# Patient Record
Sex: Female | Born: 1965 | Race: Black or African American | Hispanic: No | Marital: Single | Smoking: Current every day smoker
Health system: Southern US, Community
[De-identification: ages and names within clinical notes are randomized; demographics above are authoritative.]

## PROBLEM LIST (undated history)

## (undated) DIAGNOSIS — Z8719 Personal history of other diseases of the digestive system: Secondary | ICD-10-CM

## (undated) DIAGNOSIS — F419 Anxiety disorder, unspecified: Secondary | ICD-10-CM

## (undated) HISTORY — PX: ABDOMINAL HYSTERECTOMY: SHX81

## (undated) HISTORY — PX: FOOT SURGERY: SHX648

## (undated) HISTORY — DX: Anxiety disorder, unspecified: F41.9

## (undated) HISTORY — DX: Personal history of other diseases of the digestive system: Z87.19

## (undated) HISTORY — PX: COLONOSCOPY: SHX174

---

## 1998-01-15 ENCOUNTER — Emergency Department (HOSPITAL_COMMUNITY): Admission: EM | Admit: 1998-01-15 | Discharge: 1998-01-15 | Payer: Self-pay | Admitting: *Deleted

## 1998-04-19 ENCOUNTER — Emergency Department (HOSPITAL_COMMUNITY): Admission: EM | Admit: 1998-04-19 | Discharge: 1998-04-19 | Payer: Self-pay | Admitting: Emergency Medicine

## 1999-07-28 ENCOUNTER — Emergency Department (HOSPITAL_COMMUNITY): Admission: EM | Admit: 1999-07-28 | Discharge: 1999-07-29 | Payer: Self-pay | Admitting: Emergency Medicine

## 2000-03-04 ENCOUNTER — Emergency Department (HOSPITAL_COMMUNITY): Admission: EM | Admit: 2000-03-04 | Discharge: 2000-03-05 | Payer: Self-pay

## 2002-04-21 ENCOUNTER — Emergency Department (HOSPITAL_COMMUNITY): Admission: EM | Admit: 2002-04-21 | Discharge: 2002-04-22 | Payer: Self-pay | Admitting: Emergency Medicine

## 2002-04-22 ENCOUNTER — Encounter: Payer: Self-pay | Admitting: Emergency Medicine

## 2002-05-24 ENCOUNTER — Emergency Department (HOSPITAL_COMMUNITY): Admission: EM | Admit: 2002-05-24 | Discharge: 2002-05-24 | Payer: Self-pay | Admitting: Emergency Medicine

## 2002-07-05 ENCOUNTER — Emergency Department (HOSPITAL_COMMUNITY): Admission: EM | Admit: 2002-07-05 | Discharge: 2002-07-05 | Payer: Self-pay | Admitting: Emergency Medicine

## 2002-08-27 ENCOUNTER — Emergency Department (HOSPITAL_COMMUNITY): Admission: EM | Admit: 2002-08-27 | Discharge: 2002-08-27 | Payer: Self-pay | Admitting: Emergency Medicine

## 2002-08-27 ENCOUNTER — Encounter: Payer: Self-pay | Admitting: *Deleted

## 2002-10-03 ENCOUNTER — Emergency Department (HOSPITAL_COMMUNITY): Admission: EM | Admit: 2002-10-03 | Discharge: 2002-10-04 | Payer: Self-pay | Admitting: Emergency Medicine

## 2003-01-09 ENCOUNTER — Emergency Department (HOSPITAL_COMMUNITY): Admission: EM | Admit: 2003-01-09 | Discharge: 2003-01-10 | Payer: Self-pay

## 2003-04-16 ENCOUNTER — Emergency Department (HOSPITAL_COMMUNITY): Admission: AD | Admit: 2003-04-16 | Discharge: 2003-04-16 | Payer: Self-pay | Admitting: Emergency Medicine

## 2003-09-08 ENCOUNTER — Emergency Department (HOSPITAL_COMMUNITY): Admission: EM | Admit: 2003-09-08 | Discharge: 2003-09-08 | Payer: Self-pay

## 2004-11-27 ENCOUNTER — Emergency Department (HOSPITAL_COMMUNITY): Admission: EM | Admit: 2004-11-27 | Discharge: 2004-11-27 | Payer: Self-pay | Admitting: Emergency Medicine

## 2005-01-13 ENCOUNTER — Emergency Department (HOSPITAL_COMMUNITY): Admission: EM | Admit: 2005-01-13 | Discharge: 2005-01-13 | Payer: Self-pay | Admitting: Emergency Medicine

## 2005-02-09 ENCOUNTER — Emergency Department (HOSPITAL_COMMUNITY): Admission: EM | Admit: 2005-02-09 | Discharge: 2005-02-09 | Payer: Self-pay | Admitting: Emergency Medicine

## 2005-08-08 ENCOUNTER — Emergency Department (HOSPITAL_COMMUNITY): Admission: EM | Admit: 2005-08-08 | Discharge: 2005-08-08 | Payer: Self-pay | Admitting: Emergency Medicine

## 2005-08-09 ENCOUNTER — Inpatient Hospital Stay (HOSPITAL_COMMUNITY): Admission: AD | Admit: 2005-08-09 | Discharge: 2005-08-09 | Payer: Self-pay | Admitting: *Deleted

## 2005-08-31 ENCOUNTER — Ambulatory Visit: Payer: Self-pay | Admitting: Obstetrics and Gynecology

## 2005-08-31 ENCOUNTER — Encounter (INDEPENDENT_AMBULATORY_CARE_PROVIDER_SITE_OTHER): Payer: Self-pay | Admitting: *Deleted

## 2005-09-03 ENCOUNTER — Ambulatory Visit (HOSPITAL_COMMUNITY): Admission: RE | Admit: 2005-09-03 | Discharge: 2005-09-03 | Payer: Self-pay | Admitting: Obstetrics and Gynecology

## 2005-09-10 ENCOUNTER — Inpatient Hospital Stay (HOSPITAL_COMMUNITY): Admission: AD | Admit: 2005-09-10 | Discharge: 2005-09-10 | Payer: Self-pay | Admitting: Family Medicine

## 2005-09-21 ENCOUNTER — Ambulatory Visit: Payer: Self-pay | Admitting: Obstetrics and Gynecology

## 2006-01-09 ENCOUNTER — Encounter (INDEPENDENT_AMBULATORY_CARE_PROVIDER_SITE_OTHER): Payer: Self-pay | Admitting: *Deleted

## 2006-01-09 ENCOUNTER — Inpatient Hospital Stay (HOSPITAL_COMMUNITY): Admission: AD | Admit: 2006-01-09 | Discharge: 2006-01-10 | Payer: Self-pay | Admitting: Obstetrics and Gynecology

## 2006-01-09 ENCOUNTER — Ambulatory Visit: Payer: Self-pay | Admitting: Family Medicine

## 2006-02-09 ENCOUNTER — Ambulatory Visit: Payer: Self-pay | Admitting: Gynecology

## 2006-03-26 ENCOUNTER — Inpatient Hospital Stay (HOSPITAL_COMMUNITY): Admission: AD | Admit: 2006-03-26 | Discharge: 2006-03-26 | Payer: Self-pay | Admitting: Family Medicine

## 2006-07-24 ENCOUNTER — Emergency Department (HOSPITAL_COMMUNITY): Admission: EM | Admit: 2006-07-24 | Discharge: 2006-07-24 | Payer: Self-pay | Admitting: Emergency Medicine

## 2007-12-11 ENCOUNTER — Emergency Department (HOSPITAL_COMMUNITY): Admission: EM | Admit: 2007-12-11 | Discharge: 2007-12-11 | Payer: Self-pay | Admitting: Family Medicine

## 2008-10-02 ENCOUNTER — Emergency Department (HOSPITAL_COMMUNITY): Admission: EM | Admit: 2008-10-02 | Discharge: 2008-10-03 | Payer: Self-pay | Admitting: Emergency Medicine

## 2008-10-12 ENCOUNTER — Inpatient Hospital Stay (HOSPITAL_COMMUNITY): Admission: AD | Admit: 2008-10-12 | Discharge: 2008-10-12 | Payer: Self-pay | Admitting: Obstetrics & Gynecology

## 2008-10-15 ENCOUNTER — Inpatient Hospital Stay (HOSPITAL_COMMUNITY): Admission: RE | Admit: 2008-10-15 | Discharge: 2008-10-15 | Payer: Self-pay | Admitting: Obstetrics & Gynecology

## 2008-12-13 ENCOUNTER — Emergency Department (HOSPITAL_COMMUNITY): Admission: EM | Admit: 2008-12-13 | Discharge: 2008-12-13 | Payer: Self-pay | Admitting: Emergency Medicine

## 2009-04-27 ENCOUNTER — Encounter (INDEPENDENT_AMBULATORY_CARE_PROVIDER_SITE_OTHER): Payer: Self-pay | Admitting: Emergency Medicine

## 2009-04-27 ENCOUNTER — Ambulatory Visit: Payer: Self-pay | Admitting: Vascular Surgery

## 2009-04-27 ENCOUNTER — Emergency Department (HOSPITAL_COMMUNITY): Admission: EM | Admit: 2009-04-27 | Discharge: 2009-04-27 | Payer: Self-pay | Admitting: Emergency Medicine

## 2009-04-28 ENCOUNTER — Ambulatory Visit: Payer: Self-pay | Admitting: Cardiovascular Disease

## 2009-04-28 ENCOUNTER — Observation Stay (HOSPITAL_COMMUNITY): Admission: EM | Admit: 2009-04-28 | Discharge: 2009-04-29 | Payer: Self-pay | Admitting: Emergency Medicine

## 2009-04-29 ENCOUNTER — Encounter (INDEPENDENT_AMBULATORY_CARE_PROVIDER_SITE_OTHER): Payer: Self-pay | Admitting: Emergency Medicine

## 2010-02-19 ENCOUNTER — Emergency Department (HOSPITAL_COMMUNITY)
Admission: EM | Admit: 2010-02-19 | Discharge: 2010-02-19 | Payer: Self-pay | Source: Home / Self Care | Admitting: Emergency Medicine

## 2010-05-23 ENCOUNTER — Encounter: Payer: Self-pay | Admitting: Obstetrics and Gynecology

## 2010-08-02 LAB — DIFFERENTIAL
Basophils Absolute: 0.1 10*3/uL (ref 0.0–0.1)
Basophils Relative: 1 % (ref 0–1)
Lymphocytes Relative: 27 % (ref 12–46)
Lymphocytes Relative: 32 % (ref 12–46)
Lymphs Abs: 2.7 10*3/uL (ref 0.7–4.0)
Monocytes Absolute: 0.7 10*3/uL (ref 0.1–1.0)
Monocytes Absolute: 1.2 10*3/uL — ABNORMAL HIGH (ref 0.1–1.0)
Monocytes Relative: 12 % (ref 3–12)
Neutro Abs: 5.5 10*3/uL (ref 1.7–7.7)
Neutro Abs: 5.9 10*3/uL (ref 1.7–7.7)
Neutrophils Relative %: 57 % (ref 43–77)

## 2010-08-02 LAB — CBC
Hemoglobin: 12.4 g/dL (ref 12.0–15.0)
Hemoglobin: 13.6 g/dL (ref 12.0–15.0)
MCHC: 33.6 g/dL (ref 30.0–36.0)
MCHC: 33.8 g/dL (ref 30.0–36.0)
RBC: 4.24 MIL/uL (ref 3.87–5.11)
RBC: 4.63 MIL/uL (ref 3.87–5.11)
RDW: 13.2 % (ref 11.5–15.5)
WBC: 10 10*3/uL (ref 4.0–10.5)

## 2010-08-02 LAB — BRAIN NATRIURETIC PEPTIDE: Pro B Natriuretic peptide (BNP): <30 pg/mL (ref 0.0–100.0)

## 2010-08-02 LAB — POCT CARDIAC MARKERS
CKMB, poc: 1.9 ng/mL (ref 1.0–8.0)
CKMB, poc: 2.4 ng/mL (ref 1.0–8.0)
CKMB, poc: <1 ng/mL — ABNORMAL LOW (ref 1.0–8.0)
Myoglobin, poc: 109 ng/mL (ref 12–200)
Myoglobin, poc: 112 ng/mL (ref 12–200)
Myoglobin, poc: 90.5 ng/mL (ref 12–200)
Troponin i, poc: <0.05 ng/mL (ref 0.00–0.09)
Troponin i, poc: <0.05 ng/mL (ref 0.00–0.09)

## 2010-08-02 LAB — URINALYSIS, ROUTINE W REFLEX MICROSCOPIC
Leukocytes, UA: NEGATIVE
Specific Gravity, Urine: 1.021 (ref 1.005–1.030)
Urobilinogen, UA: 0.2 mg/dL (ref 0.0–1.0)

## 2010-08-02 LAB — CK TOTAL AND CKMB (NOT AT ARMC)
CK, MB: 2 ng/mL (ref 0.3–4.0)
CK, MB: 2.4 ng/mL (ref 0.3–4.0)
CK, MB: 2.6 ng/mL (ref 0.3–4.0)
Relative Index: 1.1 (ref 0.0–2.5)
Relative Index: 1.2 (ref 0.0–2.5)
Relative Index: 1.3 (ref 0.0–2.5)
Total CK: 184 U/L — ABNORMAL HIGH (ref 7–177)

## 2010-08-02 LAB — URINE CULTURE

## 2010-08-02 LAB — POCT I-STAT, CHEM 8
BUN: 7 mg/dL (ref 6–23)
Calcium, Ion: 1.11 mmol/L — ABNORMAL LOW (ref 1.12–1.32)
Chloride: 105 mEq/L (ref 96–112)
Chloride: 106 mEq/L (ref 96–112)
Creatinine, Ser: 1.1 mg/dL (ref 0.4–1.2)
Glucose, Bld: 105 mg/dL — ABNORMAL HIGH (ref 70–99)
HCT: 43 % (ref 36.0–46.0)
HCT: 43 % (ref 36.0–46.0)
Potassium: 3.3 mEq/L — ABNORMAL LOW (ref 3.5–5.1)
Potassium: 3.9 mEq/L (ref 3.5–5.1)
Sodium: 140 mEq/L (ref 135–145)

## 2010-08-02 LAB — CULTURE, BLOOD (ROUTINE X 2): Culture: NO GROWTH

## 2010-08-02 LAB — PROTIME-INR
INR: 1.07 (ref 0.00–1.49)
Prothrombin Time: 13.8 seconds (ref 11.6–15.2)

## 2010-08-02 LAB — URINE MICROSCOPIC-ADD ON

## 2010-08-02 LAB — TROPONIN I: Troponin I: 0.02 ng/mL (ref 0.00–0.06)

## 2010-08-02 LAB — APTT: aPTT: 30 seconds (ref 24–37)

## 2010-08-09 LAB — COMPREHENSIVE METABOLIC PANEL
ALT: 30 U/L (ref 0–35)
AST: 23 U/L (ref 0–37)
Albumin: 3.9 g/dL (ref 3.5–5.2)
Calcium: 9.3 mg/dL (ref 8.4–10.5)
Creatinine, Ser: 0.99 mg/dL (ref 0.4–1.2)
GFR calc Af Amer: >60 mL/min (ref >60)
Sodium: 137 mEq/L (ref 135–145)
Total Protein: 7 g/dL (ref 6.0–8.3)

## 2010-08-09 LAB — DIFFERENTIAL
Eosinophils Absolute: 0.3 10*3/uL (ref 0.0–0.7)
Eosinophils Relative: 3 % (ref 0–5)
Lymphocytes Relative: 35 % (ref 12–46)
Lymphs Abs: 3.9 10*3/uL (ref 0.7–4.0)
Monocytes Absolute: 0.8 10*3/uL (ref 0.1–1.0)
Monocytes Relative: 7 % (ref 3–12)

## 2010-08-09 LAB — CBC
MCHC: 34.9 g/dL (ref 30.0–36.0)
MCV: 86.5 fL (ref 78.0–100.0)
Platelets: 261 10*3/uL (ref 150–400)
RBC: 4.65 MIL/uL (ref 3.87–5.11)

## 2010-08-09 LAB — URINALYSIS, ROUTINE W REFLEX MICROSCOPIC
Bilirubin Urine: NEGATIVE
Glucose, UA: NEGATIVE mg/dL
Ketones, ur: NEGATIVE mg/dL
Leukocytes, UA: NEGATIVE
pH: 6 (ref 5.0–8.0)

## 2010-08-09 LAB — WET PREP, GENITAL
Trich, Wet Prep: NONE SEEN
Yeast Wet Prep HPF POC: NONE SEEN

## 2010-09-17 NOTE — Group Therapy Note (Signed)
Audrey, Ramirez NO.:  [id number]   MEDICAL RECORD NO.:  [id number]          PATIENT TYPE:  WOC   LOCATION:  [facility]                   FACILITY:  [facility]   PHYSICIAN:  Argentina Donovan, MD        DATE OF BIRTH:  [birth year]   DATE OF SERVICE:  08/31/2005                                    CLINIC NOTE   The patient is a 46 year old black female gravida 2, para 1-0-1-1 who went  into the MAU because severe abdominal pain.  She states that she has had the  pain on and off, mainly on, for about three years.  She has had multiple  visits, apparently, to [facility] and [facility] where they told her there  was no problems.  In addition to the pain which she describes as almost an  anterior belt pain just level with the umbilicus she complains of severe  dyspareunia.  They did an ultrasound in the MAU which results are not well  described but patient has a small intramural fibroid.  On examination the  uterus is about 8 weeks size, firm in configuration, not well mobile but  adnexa seems normal.   IMPRESSION:  Probably that she does have fibroids with possible  endometriosis and that her pain on intercourse is from deep penetration  causing ovarian trauma.  We have talked with her about this.  In addition,  she is a known patient with irritable bowel syndrome.  She has a difficult  time because her job entails standing for long periods and that is when the  pain seems to get worse.  It is improved by lying down.  She is a  complicated patient from this point of view but I feel that the pain is  real.  I think she does have a spastic colon but I think it is a strong  possibility of pain secondary to her pelvic organ condition.  I therefore  think we will end up doing a total abdominal hysterectomy with or without  oophorectomy because I think that endometriosis is a real possibility.  Her  periods are regular.  The pain does not seem to be worse during a period,  she  says.  The radiologist recommended MRI for the possibility of evaluating  better the pelvic anatomy and ruling out adenomyosis.  If we are going to do  that I told her we would also get an abdominal one since most of the pain  that she is describing seems to be out of the pelvis.  However, when I am  examining her and I push on the uterus she describes that as causing the  pain that she is feeling and duplicating it so I am somewhat confused by the  examination and by her findings.   PHYSICAL EXAMINATION:  PELVIC:  External genitalia is normal.  BUS within  normal limits.  Vagina is clean and well rugated.  The cervix is clean and  parous.  The uterus is about 8 weeks size, slightly irregular in  configuration, and mobile with difficulty.  Adnexa could not be well  outlined, but  did not seem to be enlarged.  Cul-de-sac seems somewhat full.           ______________________________  Argentina Donovan, MD     PR/MEDQ  D:  08/31/2005  T:  09/01/2005  Job:  [id number]

## 2010-09-17 NOTE — Group Therapy Note (Signed)
NAMERENLEIGH, OUELLET NO.:  [id number]   MEDICAL RECORD NO.:  [id number]          PATIENT TYPE:  WOC   LOCATION:  [facility]                   FACILITY:  [facility]   PHYSICIAN:  Argentina Donovan, MD        DATE OF BIRTH:  24-Aug-1965   DATE OF SERVICE:  09/21/2005                                    CLINIC NOTE   REASON FOR VISIT:  A 45 year old, African-American female, G2, P1-0-1-1 with  a history of tubal ligation who has chronic pelvic pain as well as spastic  colon.  She has a uterus that is very tender to the touch and by MRI signs  of adenomyosis.  She also has severe premenstrual dysphoric disorder and  desires surgery to at least take away the problems related to her uterus.  We have also talked about oophorectomy because of the PMS and we are going  to try and do this vaginally.  She has a uterus that measures about 8 weeks  in size, otherwise her physical examination is normal.  She does complain of  severe dyspareunia which I think is related to her adenomyosis rather than  her irritable bowel syndrome.  She has recently been on Metamucil and has  been much improved as far as GI symptoms.  See the previous note.  We are  planning on probably doing this in September once school starts.  We have  told her that she would probably be out of work at [employer] for at least  6 weeks after surgery.  We are going to bring her in a few days before  surgery and discuss in detail the risks as well as what to expect in  surgery.   IMPRESSION:  1.  Chronic pelvic pain with adenomyosis.  2.  Premenstrual dysphoric disorder.  3.  Irritable bowel syndrome.           ______________________________  Argentina Donovan, MD     PR/MEDQ  D:  09/21/2005  T:  09/22/2005  Job:  [id number]

## 2010-09-17 NOTE — Discharge Summary (Signed)
Audrey Ramirez, Audrey Ramirez             ACCOUNT NO.:  [id number]   MEDICAL RECORD NO.:  [id number]          PATIENT TYPE:  INP   LOCATION:  9318                          [facility]   PHYSICIAN:  Phil D. Okey Dupre, M.D.     DATE OF BIRTH:  May 31, 1965   DATE OF ADMISSION:  01/09/2006  DATE OF DISCHARGE:  01/10/2006                                 DISCHARGE SUMMARY   The patient is a 45 year old African American female gravida 2, para 1-0-0-  1, with a history of chronic pelvic pain, menorrhagia, severe dysmenorrhea,  and severe PMDD.   POSTOPERATIVE DIAGNOSES:  1. Chronic pelvic pain.  2. Menorrhagia.  3. Severe dysmenorrhea.  4. Premenstrual dysphoric disorder.  Pending pathology report.   PROCEDURE:  Total vaginal hysterectomy and bilateral salpingo-oophorectomy.   SURGEON:  Dr. Malvin Johns.   ASSISTANT:  Dr. Okey Dupre.   The patient underwent the operative procedure on the day of admission, has  done extremely well overnight, is very anxious for discharge, has had some  nausea and vomiting during the night probably secondary to the narcotic PCA  which is being stopped.  She feels well and hungry this morning, is  ambulating well.   PHYSICAL EXAMINATION:  LUNGS:  Clear.  VITAL SIGNS:  Have been stable and normal.  ABDOMEN:  Soft, flat, nontender.  EXTREMITIES:  Negative.  PELVIC:  There is no significant vaginal discharge and no CVA tenderness.   Her preoperative hemoglobin was 13.4 with a post-op in the a.m. after  surgery of 11.6.   The patient otherwise has done well and is discharged to be followed in the  office in two weeks, has been given instructions as to activity, followup,  and diet.   DISCHARGE DIAGNOSES:  Status post total vaginal hysterectomy and bilateral  salpingo-oophorectomy for pelvic pain.           ______________________________  Javier Glazier. Okey Dupre, M.D.    PDR/MEDQ  D:  01/10/2006  T:  01/10/2006  Job:  [id number]

## 2010-09-17 NOTE — H&P (Signed)
Audrey Ramirez, SKIPPER NO.:  [id number]   MEDICAL RECORD NO.:  [id number]           PATIENT TYPE:   LOCATION:                                [facility]   PHYSICIAN:  Phil D. Okey Dupre, M.D.     DATE OF BIRTH:  [birth year]   DATE OF ADMISSION:  01/02/2006  DATE OF DISCHARGE:                                HISTORY & PHYSICAL   CHIEF COMPLAINT:  Chronic severe pelvic pain with severe premenstrual and  terrible depression before periods.   HISTORY OF PRESENT ILLNESS:  A 45 year old African-American female, gravida  2, para 1-0-1-1 with a child 77 years old and a followup history of tubal  ligation 5 years later who has chronic pain in the pelvis as well as spastic  colon. The uterus is very tender to touch and by MRI shows signs of  adenomyosis. She also has severe premenstrual dysphoric disorder and desires  surgery at least to take the problems related to her uterus away. We talked  about oophorectomy and we will probably try and do this if it is feasible  through the vagina. The plan is total vaginal hysterectomy with bilateral  salpingo-oophorectomy. We discussed today the possibility of complications  with the patient especially those related to anesthesia, injury to bowel or  urinary tract, hemorrhage and infection. She does also complain of severe  dyspareunia I think is related more toward adenomyosis than her irritable  bowel syndrome. She has recently been on Metamucil and is much improved as  far as the GI symptoms. She works at [employer] and we told her she will  be out at least 6 weeks after surgery.   PAST SURGICAL HISTORY:  She has had a foot surgery about 7 years ago and a  tubal ligation approximately 10 years ago by laparoscopy.   ALLERGIES:  None.   MEDICATIONS:  She takes Geritol every day.   FAMILY HISTORY:  Her grandmother had tuberculosis otherwise there is no  family history of high blood pressure or diabetes or any other serious  illnesses.   REVIEW OF SYSTEMS:  Negative with the exception of very poor dentition,  irritable bowel syndrome discussed above and the present illness.   PHYSICAL EXAMINATION:  VITAL SIGNS:  Blood pressure was 113/71, pulse 92,  temperature 98.8, respirations 18 per minute. The patient weighs 130 pounds  and is 5 foot 2-1/2 inches tall.  GENERAL:  A well-developed, well-nourished, black female in no acute  distress.  HEENT:  PERRLA, very poor dentition otherwise within normal limits.  NECK:  Supple with no significant masses, thyroid symmetrical without  dominant masses.  BACK:  Erect. No CVA tenderness.  LUNGS:  Clear to auscultation and percussion.  HEART:  No murmur, normal sinus rhythm.  ABDOMEN:  Soft, flat, somewhat tender to deep palpation suprapubically  without guarding or rebound. Good bowel sounds. Small surgical scar just  below the umbilicus from previous laparoscopy.  BREASTS:  Symmetrical with no dominant masses.  EXTREMITIES:  There is no edema, no varices, deep tendon reflexes are within  normal limits.  GENITALIA:  External  genitalia is normal. BUS within normal limits,  introitus is marital. Vagina is clean and well rugated. The cervix is parous  and clean. Pap smear was negative. The uterus is of normal size, shape,  consistency, slightly boggy and somewhat tender to palpation. The adnexa  could not be well palpated but no enlargement of the adnexa was noted. The  cul-de-sac is free.   IMPRESSION:  1. Dyspareunia.  2. Severe dysmenorrhea.  3. Premenstrual dysphoric disorder.   PLAN:  Total abdominal hysterectomy with bilateral salpingo-oophorectomy. We  discussed estrogen replacement with the patient afterwards. We have answered  her questions as to activity in followup. She plans on being married shortly  after the surgery.           ______________________________  Javier Glazier Okey Dupre, M.D.     PDR/MEDQ  D:  01/03/2006  T:  01/03/2006  Job:  [id number]

## 2010-09-17 NOTE — Op Note (Signed)
NAMESOLEI, WUBBEN             ACCOUNT NO.:  [id number]   MEDICAL RECORD NO.:  [id number]          PATIENT TYPE:  INP   LOCATION:  9318                          [facility]   PHYSICIAN:  Tanya S. Shawnie Pons, M.D.   DATE OF BIRTH:  12-24-1965   DATE OF PROCEDURE:  01/09/2006  DATE OF DISCHARGE:                                 OPERATIVE REPORT   PREOPERATIVE DIAGNOSIS:  Chronic pelvic pain, dysmenorrhea, dyspareunia and  PMDD.   POSTOPERATIVE DIAGNOSIS:  Chronic pelvic pain, dysmenorrhea, dyspareunia and  PMDD, fibroid uterus, left ovarian cyst.   OPERATION PERFORMED:  Transvaginal hysterectomy.   SURGEON:  Shelbie Proctor. Shawnie Pons, M.D.   ASSISTANTMichele Mcalpine D. Okey Dupre, M.D.   ANESTHESIA:  General and local.   ASSESSMENT:  Uterus, tubes and ovaries.   ESTIMATED BLOOD LOSS:  200 mL.   COMPLICATIONS:  None.   INDICATIONS FOR PROCEDURE:  Briefly the patient is a 45 year old gravida 2,  para 1-0-1-1, who had a history of previous bilateral tubal ligation who had  chronic pelvic pain, questionable adenomyosis by MRI as well as the symptoms  as outlined above.  The patient desired permanent treatment with TVH and  BSO.   DESCRIPTION OF PROCEDURE:  The patient was taken to the operating room where  she was placed in dorsal lithotomy position in Volant stirrups.  She was  prepped and draped in the usual sterile fashion.  Lowe speculum was placed  inside the vagina.  The cervix was circumferentially injected with 0.5%  Marcaine with epinephrine circumferentially.  A circumferential incision was  made about the cervix through the vagina.  Blunt dissection was used to  dissect the vagina off the underlying cervix.  Anteriorly the anterior  peritoneal cavity was entered sharply without difficulty and a retractor  placed inside this opening.  Similarly the posterior peritoneal cavity was  entered sharply and tied with a 0 Vicryl suture.  A long weighted speculum  was then placed inside the  peritoneal cavity.  The uterosacral ligaments  were clamped, cut and suture ligated with 0 chromic suture in a Heaney type  stitch.  These sutures were held.  The Gyrus was then used to take down the  uterine and in sequential steps up the uterus until the tubo-ovarian  pedicles were found.  These were clamped and the uterus was removed.  The  patient's right ovary and tubes were easily visualized at this time so they  were brought up through the incision.  The infundibulopelvic was clamped and  cut.  A small amount of bleeding from the most medial portion of the  infundibulopelvic ligament and another clamp was placed here.  Free ties and  a stick tie were then used to secure the pedicles around both sides and good  hemostasis was noted.  The left tube was brought down and found to have a  large cyst as well as adhesions of the tube to the ovary but the  infundibulopelvic ligament was clamped and a free tie and a stick tie were  used to secure the pedicle.  Some of the portions of  tissue were also taken  down and free ties were used to create hemostasis.  Post delivery of all  specimens which were sent to pathology, all of the pedicles were examined  and found to be hemostatic.  Then anteriorly, an Allis clamp was used to  grasp the anterior peritoneum as well as just a section of the vagina.  A  long __________ chromic suture was then used to close the vagina  horizontally incorporating the aforementioned uterosacral pedicles as well  as the peritoneum posteriorly.  Good hemostasis was again noted and all ties  and instruments were removed from the vagina.  The Foley catheter was placed  inside the bladder.  Clear yellow urine was noted.  All sponge, lap, needle  and instruments were all correct.  The patient was awakened and taken to  recovery room in stable condition.           ______________________________  Shelbie Proctor Shawnie Pons, M.D.     TSP/MEDQ  D:  01/09/2006  T:  01/10/2006  Job:   [id number]

## 2010-10-12 ENCOUNTER — Emergency Department (HOSPITAL_COMMUNITY): Payer: Self-pay

## 2010-10-12 ENCOUNTER — Emergency Department (HOSPITAL_COMMUNITY)
Admission: EM | Admit: 2010-10-12 | Discharge: 2010-10-12 | Disposition: A | Discharge status: Home / Self Care | Payer: Self-pay | Attending: Emergency Medicine | Admitting: Emergency Medicine

## 2010-10-12 DIAGNOSIS — J40 Bronchitis, not specified as acute or chronic: Secondary | ICD-10-CM | POA: Insufficient documentation

## 2010-10-12 DIAGNOSIS — J45909 Unspecified asthma, uncomplicated: Secondary | ICD-10-CM | POA: Insufficient documentation

## 2010-10-12 DIAGNOSIS — F17200 Nicotine dependence, unspecified, uncomplicated: Secondary | ICD-10-CM | POA: Insufficient documentation

## 2010-10-12 DIAGNOSIS — R059 Cough, unspecified: Secondary | ICD-10-CM | POA: Insufficient documentation

## 2010-10-12 DIAGNOSIS — R0602 Shortness of breath: Secondary | ICD-10-CM | POA: Insufficient documentation

## 2010-10-12 DIAGNOSIS — R05 Cough: Secondary | ICD-10-CM | POA: Insufficient documentation

## 2011-07-17 ENCOUNTER — Emergency Department (HOSPITAL_COMMUNITY): Payer: Self-pay

## 2011-07-17 ENCOUNTER — Encounter (HOSPITAL_COMMUNITY): Payer: Self-pay | Admitting: Emergency Medicine

## 2011-07-17 ENCOUNTER — Emergency Department (HOSPITAL_COMMUNITY)
Admission: EM | Admit: 2011-07-17 | Discharge: 2011-07-17 | Disposition: A | Discharge status: Home / Self Care | Payer: Self-pay | Attending: Emergency Medicine | Admitting: Emergency Medicine

## 2011-07-17 DIAGNOSIS — J40 Bronchitis, not specified as acute or chronic: Secondary | ICD-10-CM | POA: Insufficient documentation

## 2011-07-17 DIAGNOSIS — J45909 Unspecified asthma, uncomplicated: Secondary | ICD-10-CM | POA: Insufficient documentation

## 2011-07-17 MED ORDER — ALBUTEROL SULFATE HFA 108 (90 BASE) MCG/ACT IN AERS
2.0000 | INHALATION_SPRAY | RESPIRATORY_TRACT | Status: DC | PRN
  Administered 2011-07-17: 2 via RESPIRATORY_TRACT
  Filled 2011-07-17: qty 6.7

## 2011-07-17 MED ORDER — ALBUTEROL SULFATE (5 MG/ML) 0.5% IN NEBU
5.0000 mg | INHALATION_SOLUTION | Freq: Once | RESPIRATORY_TRACT | Status: AC
  Administered 2011-07-17: 5 mg via RESPIRATORY_TRACT
  Filled 2011-07-17: qty 1

## 2011-07-17 MED ORDER — IBUPROFEN 800 MG PO TABS
800.0000 mg | ORAL_TABLET | Freq: Once | ORAL | Status: AC
  Administered 2011-07-17: 800 mg via ORAL
  Filled 2011-07-17: qty 1

## 2011-07-17 MED ORDER — AZITHROMYCIN 250 MG PO TABS: ORAL_TABLET | ORAL | Status: AC

## 2011-07-17 MED ORDER — IPRATROPIUM BROMIDE 0.02 % IN SOLN
0.5000 mg | Freq: Once | RESPIRATORY_TRACT | Status: AC
  Administered 2011-07-17: 0.5 mg via RESPIRATORY_TRACT
  Filled 2011-07-17: qty 2.5

## 2011-07-17 NOTE — ED Provider Notes (Signed)
History     CSN: [id number]  Arrival date & time 07/17/11  4098   None     Chief Complaint  Patient presents with  . Cough    (Consider location/radiation/quality/duration/timing/severity/associated sxs/prior treatment) Patient is a 46 y.o. female presenting with cough. The history is provided by the patient. No language interpreter was used.  Cough This is a new problem. The current episode started more than 1 week ago. The cough is productive of sputum. The maximum temperature recorded prior to her arrival was 102 to 102.9 F. Associated symptoms include chills, sweats, weight loss, ear pain, rhinorrhea, sore throat, myalgias, shortness of breath and wheezing. Pertinent negatives include no chest pain and no headaches. She has tried decongestants for the symptoms. The treatment provided mild relief. She is not a smoker. Her past medical history is significant for bronchitis, pneumonia and asthma. Her past medical history does not include bronchiectasis, COPD or emphysema.  Upper respiratory symptoms x 1 month.  Cough especially at night.   Past Medical History  Diagnosis Date  . Asthma     No past surgical history on file.  No family history on file.  History  Substance Use Topics  . Smoking status: Not on file  . Smokeless tobacco: Not on file  . Alcohol Use: 1.2 oz/week    2 Cans of beer per week    OB History    Grav Para Term Preterm Abortions TAB SAB Ect Mult Living                  Review of Systems  Constitutional: Positive for chills and weight loss.  HENT: Positive for ear pain, congestion, sore throat, rhinorrhea and postnasal drip. Negative for tinnitus.   Respiratory: Positive for cough, shortness of breath and wheezing. Negative for chest tightness.   Cardiovascular: Negative for chest pain.  Musculoskeletal: Positive for myalgias.  Neurological: Negative for dizziness, syncope, light-headedness and headaches.    Allergies  Review of patient's  allergies indicates no known allergies.  Home Medications   Current Outpatient Rx  Name Route Sig Dispense Refill  . DM-GUAIFENESIN ER 30-600 MG PO TB12 Oral Take 1 tablet by mouth every 12 (twelve) hours.    Marland Kitchen ALKA-SELTZER PLS NIGHT CLD/FLU PO Oral Take 2 tablets by mouth at bedtime as needed. Cold/cough flu      BP 154/114  Pulse 52  Temp(Src) 98.1 F (36.7 C) (Oral)  Resp 22  SpO2 98%  Physical Exam  Nursing note and vitals reviewed. Constitutional: She is oriented to person, place, and time. She appears well-developed and well-nourished.  HENT:  Head: Normocephalic and atraumatic.  Eyes: Conjunctivae and EOM are normal. Pupils are equal, round, and reactive to light.  Neck: Normal range of motion. Neck supple.  Cardiovascular: Normal rate, regular rhythm, normal heart sounds and intact distal pulses.  Exam reveals no gallop and no friction rub.   No murmur heard. Pulmonary/Chest: Effort normal. She has wheezes.       Rh rll  Abdominal: Soft. Bowel sounds are normal.  Musculoskeletal: Normal range of motion. She exhibits no edema and no tenderness.  Neurological: She is alert and oriented to person, place, and time. She has normal reflexes.  Skin: Skin is warm and dry.  Psychiatric: She has a normal mood and affect.    ED Course  Procedures (including critical care time)  Labs Reviewed - No data to display No results found.   No diagnosis found.    MDM  Cough and upper respiratory symptoms x 1 month treated with inhaler and ibuprofen.  Feels better.  Will get pcp from list to follow up with.  Return for high fever or uncontrolled nausea and vomiting or cp.  Chest x-ray normal.        Jethro Bastos, NP 07/18/11 1029

## 2011-07-17 NOTE — Discharge Instructions (Signed)
Audrey Ramirez the chest x-ray shows no pneumonia.  Use the inhaler no more than 4 times a day.  Take ibuprofen 600mg  every 6 hours x 24.  Follow up with a pcp of your choice of pick one from the  list below.   RESOURCE GUIDE  Dental Problems  Patients with Medicaid: [facility] [phone] [address]                                           [phone] [address] Phone:  [phone]                                                  Phone:  [phone]  If unable to pay or uninsured, contact:  [facility] or [facility]. to become qualified for the adult dental clinic.  Chronic Pain Problems Contact [facility]  [phone] Patients need to be referred by their primary care doctor.  Insufficient Money for Medicine Contact [employer]:  call "211" or [facility] 219-047-8773.  No Primary Care Doctor Call [facility]  [phone] Other agencies that provide inexpensive medical care    [facility]  [phone]    [facility]  [phone]    [facility]  [phone]    [facility]  [phone]    [facility]  3143222772    [facility]  [phone]  Psychological Services [facility]  [phone] [facility]  [phone] [facility]   [phone] (emergency services [phone])  Substance Abuse Resources [facility]  (224)088-9538 [facility] [phone] [facility] [phone] Floydene Flock [phone] Residential & Outpatient Substance Abuse Program  [phone]  Abuse/Neglect [facility] Child Abuse Hotline (320)330-6566 [facility] Child Abuse Hotline (220)133-9457 (After Hours)  [facility]gency [facility] 760-767-6150  [facility] [place] [facility] [place] 9316578339 [facility] Services [phone]  MRSA Hotline #:   715-060-8819    [facility] Resources  [facility]     [employer]                          [facility] [address] [place]                       [address]      [address]  [place]                                                Cristobal Goldmann Phone:  [phone]                                   Phone:  [phone]                 Phone:  [phone]  [facility]  [facility] Phone:  [phone]  [facility] Child Abuse Hotline (732)471-5112 (941)212-6601 (After Hours) Bronchitis Bronchitis is a problem of the air tubes leading to your lungs. This problem makes it hard for air to get in and out of the lungs. You may cough a lot because your air tubes are narrow. Going without care can cause lasting (chronic)  bronchitis. HOME CARE   Drink enough fluids to keep your pee (urine) clear or pale yellow.   Use a cool mist humidifier.   Quit smoking if you smoke. If you keep smoking, the bronchitis might not get better.   Only take medicine as told by your doctor.  GET HELP RIGHT AWAY IF:   Coughing keeps you awake.   You start to wheeze.   You become more sick or weak.   You have a hard time breathing or get short of breath.   You cough up blood.   Coughing lasts more than 2 weeks.   You have a fever.   Your baby is older than 3 months with a rectal temperature of 102 F (38.9 C) or higher.   Your baby is 70 months old or younger with a rectal temperature of 100.4 F (38 C) or higher.  MAKE SURE YOU:  Understand these instructions.   Will watch your condition.   Will get help right away if you are not doing well or get worse.  Document Released: 10/05/2007 Document Revised: 04/07/2011 Document Reviewed: 03/20/2009 [facility] Patient Information 2012 [place], [place].Antibiotic Nonuse  Your caregiver felt that the infection or problem was not one that would be helped with an antibiotic. Infections may be caused by viruses or bacteria. Only a caregiver can tell which one of these is the likely cause of an illness. A cold is the most common cause of infection in both adults and children. A cold is a virus. Antibiotic treatment will have no effect on a viral infection. Viruses can lead to many lost days of work caring for sick children and many missed days of school. Children may catch as many as 10 "colds" or "flus" per year during which they can be tearful, cranky, and uncomfortable. The goal of treating a virus is aimed at keeping the ill person comfortable. Antibiotics are medications used to help the body fight bacterial infections. There are relatively few types of bacteria that cause infections but there are hundreds of viruses. While both viruses and bacteria cause infection they are  very different types of germs. A viral infection will typically go away by itself within 7 to 10 days. Bacterial infections may spread or get worse without antibiotic treatment. Examples of bacterial infections are:  Sore throats (like strep throat or tonsillitis).   Infection in the lung (pneumonia).   Ear and skin infections.  Examples of viral infections are:  Colds or flus.   Most coughs and bronchitis.   Sore throats not caused by Strep.   Runny noses.  It is often best not to take an antibiotic when a viral infection is the cause of the problem. Antibiotics can kill off the helpful bacteria that we have inside our body and allow harmful bacteria to start growing. Antibiotics can cause side effects such as allergies, nausea, and diarrhea without helping to improve the symptoms of the viral infection. Additionally, repeated uses of antibiotics can cause bacteria inside of our body to become resistant. That resistance can be passed onto harmful bacterial. The next time you have an infection it may  be harder to treat if antibiotics are used when they are not needed. Not treating with antibiotics allows our own immune system to develop and take care of infections more efficiently. Also, antibiotics will work better for [place] when they are prescribed for bacterial infections. Treatments for a child that is ill may include:  Give extra fluids throughout the day to stay hydrated.   Get plenty of rest.   Only give your child over-the-counter or prescription medicines for pain, discomfort, or fever as directed by your caregiver.   The use of a cool mist humidifier may help stuffy noses.   Cold medications if suggested by your caregiver.  Your caregiver may decide to start you on an antibiotic if:  The problem you were seen for today continues for a longer length of time than expected.   You develop a secondary bacterial infection.  SEEK MEDICAL CARE IF:  Fever lasts longer than 5 days.     Symptoms continue to get worse after 5 to 7 days or become severe.   Difficulty in breathing develops.   Signs of dehydration develop (poor drinking, rare urinating, dark colored urine).   Changes in behavior or worsening tiredness (listlessness or lethargy).  Document Released: 06/27/2001 Document Revised: 04/07/2011 Document Reviewed: 12/24/2008 [facility] Patient Information 2012 [place], [place].

## 2011-07-17 NOTE — ED Notes (Signed)
Pt. Stated, I've had a could, cough , and congestion for a month and nothing is helping , now I feel weak.

## 2011-07-17 NOTE — ED Notes (Signed)
Pt. Is mostly c/o SOB

## 2011-07-21 NOTE — ED Provider Notes (Signed)
Medical screening examination/treatment/procedure(s) were performed by non-physician practitioner and as supervising physician I was immediately available for consultation/collaboration.  Celene Kras, MD 07/21/11 [phone]

## 2013-11-05 ENCOUNTER — Encounter (HOSPITAL_COMMUNITY): Payer: Self-pay | Admitting: Emergency Medicine

## 2013-11-05 ENCOUNTER — Emergency Department (HOSPITAL_COMMUNITY)
Admission: EM | Admit: 2013-11-05 | Discharge: 2013-11-05 | Disposition: A | Discharge status: Home / Self Care | Payer: Self-pay | Attending: Emergency Medicine | Admitting: Emergency Medicine

## 2013-11-05 DIAGNOSIS — K029 Dental caries, unspecified: Secondary | ICD-10-CM | POA: Insufficient documentation

## 2013-11-05 DIAGNOSIS — R5382 Chronic fatigue, unspecified: Secondary | ICD-10-CM

## 2013-11-05 DIAGNOSIS — R5383 Other fatigue: Principal | ICD-10-CM

## 2013-11-05 DIAGNOSIS — R5381 Other malaise: Secondary | ICD-10-CM | POA: Insufficient documentation

## 2013-11-05 DIAGNOSIS — F17200 Nicotine dependence, unspecified, uncomplicated: Secondary | ICD-10-CM | POA: Insufficient documentation

## 2013-11-05 DIAGNOSIS — J45909 Unspecified asthma, uncomplicated: Secondary | ICD-10-CM | POA: Insufficient documentation

## 2013-11-05 DIAGNOSIS — R634 Abnormal weight loss: Secondary | ICD-10-CM | POA: Insufficient documentation

## 2013-11-05 LAB — CBC WITH DIFFERENTIAL/PLATELET
BASOS PCT: 0 % (ref 0–1)
Basophils Absolute: 0 10*3/uL (ref 0.0–0.1)
Eosinophils Absolute: 0.2 10*3/uL (ref 0.0–0.7)
Eosinophils Relative: 3 % (ref 0–5)
HEMATOCRIT: 39.7 % (ref 36.0–46.0)
HEMOGLOBIN: 13.6 g/dL (ref 12.0–15.0)
LYMPHS ABS: 3.9 10*3/uL (ref 0.7–4.0)
Lymphocytes Relative: 43 % (ref 12–46)
MCH: 30 pg (ref 26.0–34.0)
MCHC: 34.3 g/dL (ref 30.0–36.0)
MCV: 87.4 fL (ref 78.0–100.0)
MONO ABS: 0.9 10*3/uL (ref 0.1–1.0)
MONOS PCT: 10 % (ref 3–12)
NEUTROS ABS: 4 10*3/uL (ref 1.7–7.7)
Neutrophils Relative %: 44 % (ref 43–77)
Platelets: 234 10*3/uL (ref 150–400)
RBC: 4.54 MIL/uL (ref 3.87–5.11)
RDW: 12.7 % (ref 11.5–15.5)
WBC: 9 10*3/uL (ref 4.0–10.5)

## 2013-11-05 LAB — BASIC METABOLIC PANEL
Anion gap: 12 (ref 5–15)
BUN: 15 mg/dL (ref 6–23)
CHLORIDE: 102 meq/L (ref 96–112)
CO2: 28 meq/L (ref 19–32)
CREATININE: 0.88 mg/dL (ref 0.50–1.10)
Calcium: 9.4 mg/dL (ref 8.4–10.5)
GFR calc non Af Amer: 76 mL/min — ABNORMAL LOW (ref >90)
GFR, EST AFRICAN AMERICAN: 89 mL/min — AB (ref >90)
GLUCOSE: 88 mg/dL (ref 70–99)
Potassium: 3.9 mEq/L (ref 3.7–5.3)
Sodium: 142 mEq/L (ref 137–147)

## 2013-11-05 MED ORDER — PENICILLIN V POTASSIUM 500 MG PO TABS
500.0000 mg | ORAL_TABLET | Freq: Four times a day (QID) | ORAL | Status: AC
Start: 2013-11-05 — End: 2013-11-12

## 2013-11-05 MED ORDER — PENICILLIN V POTASSIUM 250 MG PO TABS
500.0000 mg | ORAL_TABLET | Freq: Once | ORAL | Status: AC
  Administered 2013-11-05: 500 mg via ORAL
  Filled 2013-11-05: qty 2

## 2013-11-05 NOTE — ED Provider Notes (Signed)
CSN: [id number]     Arrival date & time 11/05/13  0009 History   First MD Initiated Contact with Patient 11/05/13 [phone]     Chief Complaint  Patient presents with  . Fatigue     (Consider location/radiation/quality/duration/timing/severity/associated sxs/prior Treatment) HPI 48 year old female presents to emergency department from home with complaint of fatigue, generalized weakness, poor appetite.  Symptoms have been ongoing for the last 6-8 months but have been worsening.  Patient reports that she has no appetite so normal he only has a small milligrams this then does not eat again until the next day.  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  She does not have a might see a dentist.  She denies any fever or chills.  She denies any pain associated with abscesses.  Patient reports that she can feel the abscess fluid draining down her throat, and feels that she has poison from the infection.  Patient reports approximately 70 pound weight loss over the last 8 months since losing her appetite.  Patient is a current smoker.  She does not have a primary care doctor or dentist currently.  She denies any other chronic medical problems. Past Medical History  Diagnosis Date  . Asthma    History reviewed. No pertinent past surgical history. History reviewed. No pertinent family history. History  Substance Use Topics  . Smoking status: Current Every Day Smoker -- 0.50 packs/day for 25 years    Types: Cigarettes  . Smokeless tobacco: Not on file  . Alcohol Use: 1.2 oz/week    2 Cans of beer per week   OB History   Grav Para Term Preterm Abortions TAB SAB Ect Mult Living                 Review of Systems  See History of Present Illness; otherwise all other systems are reviewed and negative    Allergies  Review of patient's allergies indicates no known allergies.  Home Medications   Prior to Admission medications   Medication Sig Start Date  End Date Taking? Authorizing Provider  diphenhydramine-acetaminophen (TYLENOL PM) 25-500 MG TABS Take 1 tablet by mouth at bedtime as needed (for pain / sleep).    Yes Historical Provider, MD  penicillin v potassium (VEETID) 500 MG tablet Take 1 tablet (500 mg total) by mouth 4 (four) times daily. 11/05/13 11/12/13  Olivia Mackielga M Aidin Doane, MD   BP 129/78  Pulse 72  Temp(Src) 98.2 F (36.8 C) (Oral)  Resp 16  Ht 5' 2.5" (1.588 m)  Wt 133 lb (60.328 kg)  BMI 23.92 kg/m2  SpO2 99% Physical Exam  Nursing note and vitals reviewed. Constitutional: She is oriented to person, place, and time. She appears well-developed and well-nourished.  HENT:  Head: Normocephalic and atraumatic.  Right Ear: External ear normal.  Left Ear: External ear normal.  Nose: Nose normal.  Mouth/Throat: Oropharynx is clear and moist.  Patient has multiple dental caries in her remaining teeth.  There is no surrounding fluctuance or signs of active infection.  Eyes: Conjunctivae and EOM are normal. Pupils are equal, round, and reactive to light.  Neck: Normal range of motion. Neck supple. No JVD present. No tracheal deviation present. No thyromegaly present.  Cardiovascular: Normal rate, regular rhythm, normal heart sounds and intact distal pulses.  Exam reveals no gallop and no friction rub.   No murmur heard. Pulmonary/Chest: Effort normal and breath sounds normal. No stridor. No respiratory distress. She has no  wheezes. She has no rales. She exhibits no tenderness.  Abdominal: Soft. Bowel sounds are normal. She exhibits no distension and no mass. There is no tenderness. There is no rebound and no guarding.  Musculoskeletal: Normal range of motion. She exhibits no edema and no tenderness.  Lymphadenopathy:    She has no cervical adenopathy.  Neurological: She is alert and oriented to person, place, and time. She has normal reflexes. No cranial nerve deficit. She exhibits normal muscle tone. Coordination normal.  Skin: Skin is  warm and dry. No rash noted. No erythema. No pallor.  Psychiatric: She has a normal mood and affect. Her behavior is normal. Judgment and thought content normal.    ED Course  Procedures (including critical care time) Labs Review Labs Reviewed  BASIC METABOLIC PANEL - Abnormal; Notable for the following:    GFR calc non Af Amer 76 (*)    GFR calc Af Amer 89 (*)    All other components within normal limits  CBC WITH DIFFERENTIAL    Imaging Review No results found.   EKG Interpretation None      MDM   Final diagnoses:  Dental caries  Chronic fatigue  Weight loss    48 year old female with multiple dental caries, with reported fatigue and weight loss.  Patient will made further workup as an outpatient.  We'll start her on penicillin for infection here, and refer her to a local dentist.,  As well as to local primary care doctors.   Olivia Mackielga M Mearl Harewood, MD 11/05/13 [phone]

## 2013-11-05 NOTE — ED Notes (Signed)
Patient here with complaint of generalized fatigue. States that she has chronic dental abscesses; Explains that she has been dealing with them at home using peroxide, but they have been more frequent. States that she feels like her "system is poisoned" from all the abscesses. Goes on to explain that she has lost 70lb over the past 8 months without intending to.

## 2013-11-05 NOTE — Discharge Instructions (Signed)
°Emergency Department Resource Guide °1) Find a Doctor and Pay Out of Pocket °Although you won't have to find out who is covered by your insurance plan, it is a good idea to ask around and get recommendations. You will then need to call the office and see if the doctor you have chosen will accept you as a new patient and what types of options they offer for patients who are self-pay. Some doctors offer discounts or will set up payment plans for their patients who do not have insurance, but you will need to ask so you aren't surprised when you get to your appointment. ° °2) Contact Your Local Health Department °Not all health departments have doctors that can see patients for sick visits, but many do, so it is worth a call to see if yours does. If you don't know where your local health department is, you can check in your phone book. The CDC also has a tool to help you locate your state's health department, and many state websites also have listings of all of their local health departments. ° °3) Find a Walk-in Clinic °If your illness is not likely to be very severe or complicated, you may want to try a walk in clinic. These are popping up all over the country in pharmacies, drugstores, and shopping centers. They're usually staffed by nurse practitioners or physician assistants that have been trained to treat common illnesses and complaints. They're usually fairly quick and inexpensive. However, if you have serious medical issues or chronic medical problems, these are probably not your best option. ° °No Primary Care Doctor: °- Call [facility] at  832-8000 - they can help you locate a primary care doctor that  accepts your insurance, provides certain services, etc. °- Physician Referral Service- 1-800-533-3463 ° °Chronic Pain Problems: °Organization         Address  Phone   Notes  °[facility]  (336) [phone] Patients need to be referred by their primary care doctor.  ° °Medication  Assistance: °Organization         Address  Phone   Notes  °[place] [facility] [address] °[place], [place] [address] (336) 641-8030 --Must be a resident of [place] °-- Must have NO insurance coverage whatsoever (no Medicaid/ Medicare, etc.) °-- The pt. MUST have a primary care doctor that directs their care regularly and follows them in the community °  °[facility]  (866) 331-1348   °[employer]  (888) 892-1162   ° °Agencies that provide inexpensive medical care: °Organization         Address  Phone   Notes  °[facility]  (336) 832-8035   °[facility]    (336) 832-7272   °[facility] [address] °[place], [place] [address] (336) 832-4777   °[facility] [address], °[place] (336) 271-4999   °[facility]    (336) 373-0678   °[facility]    (336) 272-1050   °[facility] ° [address], [place] Phone:  (336) 832-4444, Fax:  (336) 832-4440 Hours of Operation:  9 am - 6 pm, M-F.  Also accepts Medicaid/Medicare and self-pay.  °[facility] ° [address], [place] Phone: (336) [phone], Fax: (336) [phone]. Hours of Operation:  8:30 am - 5:30 pm, M-F.  Also accepts Medicaid and self-pay.  °[facility] [place] [address], [place] Phone: (336) 878-6027   °[facility] [address], [place], [place] (336)723-1848, Ext. 123 Mondays & Thursdays: 7-9 AM.  First 15 patients are seen on a first come, first serve basis. °  ° °Medicaid-accepting [place] Providers: ° °Organization         Address  Phone   Notes  °[facility] [address], [place] (336) 641-2100 Also accepts self-pay patients.  °[facility] [address], [place] ° (336) 856-9996   °[facility] [address], [place]  (336) 288-8857   °[facility] [address] [place] [address], [place] (336) 299-7000   °Veita Bland [address], [place]  ° (336) 373-1557 Only accepts [place] Access Medicaid patients after they have their name applied to their card.  ° °Self-Pay (no insurance) in [place]: ° °Organization         Address  Phone   Notes  °Sickle Cell Patients, [facility] [address], [place] (336) 832-1970   °[facility] [address], [place] (336) 832-4400   °[facility] ° [address], [place] (336) 992-4800   °[facility]/Dr. Osei-Bonsu ° [address] [place] [address], [place] or [address], [place] (336) [phone] Phone number for both [place] and [place] locations is the same.  °[facility] [address], [place] (336) 299-0000   °[facility] [address] [place] [address], [place] or [address] (336) 852-7530 °(336) 878-2260   °[facility] [address], [place] (336) 350-1642, phone; (336) 294-5005, fax Sees patients 1st and 3rd Saturday of every month.  Must not qualify for public or private insurance (i.e. Medicaid, Medicare, [place] Health Choice, Veterans' Benefits) • Household income should be no more than 200% of the poverty level •The clinic cannot treat you if you are pregnant or think you are pregnant • Sexually transmitted diseases are not treated at the clinic.  ° ° °Dental Care: °Organization         Address  Phone  Notes  °[place] [facility] [address], [place] (336) 641-6152 Accepts children up to age 21 who are enrolled in Medicaid or [place] Health Choice; pregnant women with a Medicaid card; and children who have applied for Medicaid or [place] Health Choice, but were declined, whose parents can pay a reduced fee at time of service.  °[place]  [facility] [place]  [address], [place] (336) 641-7733 Accepts children up to age 21 who are enrolled in Medicaid or [place] Health Choice; pregnant women with a Medicaid card; and children who have applied for Medicaid or [place] Health Choice, but were declined, whose parents can pay a reduced fee at time of service.  °[facility] ° [address], [place] (336) 641-4533 Patients are seen by appointment only. Walk-ins are not accepted. [facility] will see patients 18 years of age and older. °Monday - Tuesday (8am-5pm) °Most Wednesdays (8:30-5pm) °$30 per visit, cash only  °[facility] ° [address], [place] (336) 641-4533 Patients are seen by appointment only. Walk-ins are not accepted. [facility] will see patients 18 years of age and older. °One   Wednesday Evening (Monthly: Volunteer Based).  $30 per visit, cash only  °[facility]  (919) [phone] for adults; Children under age 4, call Graduate Pediatric Dentistry at ([phone]) [phone]. Children aged 4-14, please call ([phone]) [phone] to request a pediatric application. ° Dental services are provided in all areas of dental care including fillings, crowns and bridges, complete and partial dentures, implants, gum treatment, root canals, and extractions. Preventive care is also provided. Treatment is provided to both adults and children. °Patients are selected via a lottery and there is often a waiting list. °  °[facility] [address], °[place] ° (336) 763-8833 [email or web address] °  °[facility] [address], [place], [place] (336)723-1848, Ext. 123 Second and Fourth Thursday of each month, opens at 6:30 AM; Clinic ends at 9 AM.  Patients are seen on a first-come first-served basis, and a limited number are seen during each clinic.  ° °[facility] ° [address], [place], [place] (336) 723-7904    Eligibility Requirements °You must have lived in [place], [place], or [place] counties for at least the last three months. °  You cannot be eligible for state or federal sponsored healthcare insurance, including Veterans Administration, Medicaid, or Medicare. °  You generally cannot be eligible for healthcare insurance through your employer.  °  How to apply: °Eligibility screenings are held every Tuesday and Wednesday afternoon from 1:00 pm until 4:00 pm. You do not need an appointment for the interview!  °[facility] [address], [place], [place] 336-631-2330   °[facility]  336-342-8273   °[facility]  336-703-3100   °[facility]  336-570-6415   ° °Behavioral Health Resources in the Community: °Intensive Outpatient Programs °Organization         Address  Phone  Notes  °[facility] [address], [place], [place] 336-878-6098   °[facility] [address], [place], [place] 336-832-9800   °[facility] [address], [place], [place] ° 336-882-2125   °[facility] [address],  °[place], [place] 1-800-853-5163 or 336-641-4981   °Substance Abuse Resources °Organization         Address  Phone  Notes  °[facility]  336-882-2125   °[facility]  336-784-9470   °[facility]  336-285-9073   °[facility]  336-845-3988   °Residential & Outpatient Substance Abuse Program  1-800-659-3381   °Psychological Services °Organization         Address  Phone  Notes  °[facility]  336- 832-9600   °[facility]  336- 378-7881   °[facility] [address], [place] 1-800-853-5163 or 336-641-4981   ° °Mobile Crisis Teams °Organization         Address  Phone  Notes  °[facility], Mobile Crisis Care Unit  1-877-626-1772   °Assertive °[facility] ° [address]  [place], [place] 336-834-9664   °Sharon DeEsch [address] °[place] 336-554-5454   ° °Self-Help/Support Groups °Organization         Address  Phone             Notes  °[facility]  - variety of support groups  336- 373-1402 Call for more information  °Narcotics Anonymous (NA), [facility] [address], °[place]  2 meetings at this location  ° °  Residential Treatment Programs Organization         Address  Phone  Notes  [facility] [address],    [place]  [phone]   [facility]  [address], [place] [phone], [place], [place] [phone]   [facility] [address], [place] [phone] Admissions: 8am-3pm M-F  [facility] [address]    [place], [place] [phone]   [facility] [address], [place], [place] [phone]   [facility] [address]  [place], [place] [phone]   [facility] Intensive Outpatient [address], [place], [place] [phone]   [facility] [address]  [place], [place] [phone] or [phone]   [facility] [address] [place], [place] [phone] Accepts Medicaid  [facility] [address]  [place] [phone] Substance Abuse/Addiction Treatment   [facility]         Address  Phone  Notes  [facility]  205-855-9057(888) ([phone]   Angie FavaJulie Brannon, PhD [address] [place], [place]   [phone](336) [phone] or ([phone](336) ([phone]   [facility]   [address] [place], [place] [phone](336) [phone]   [facility] [address], [place], [place] [phone](336) ([phone] Insurance/Medicaid/sponsorship through [facility]  [facility] [address]                                    [place], [place] [phone](336) [phone] Therapy/tele-psych/case    [facility] [address]   [place], [place] [phone](336) [phone]    Dr. Lolly MustacheArfeen  [phone](336) [phone]   [facility]  [employer] [facility] 1) [address], [place] 2) [address], [place] 3)  [address], [place] [phone](336) [phone](336) [phone]  [phone](336) [phone]   [facility] Child Abuse Hotline (252) 826-0069(336) (503) 028-1846 or [phone](336) [phone] (After Hours)       Dental Caries  Dental caries (also called tooth decay) is the most common oral disease. It can occur at any age, but is more common in children and young adults.  HOW DENTAL CARIES DEVELOPS  The process of decay begins when bacteria and foods (particularly sugars and starches) combine in your mouth to produce plaque. Plaque is a substance that sticks to the hard, outer surface of a tooth (enamel). The bacteria in plaque produce acids that attack enamel. These acids may also attack the root surface of a tooth (cementum) if it is exposed. Repeated attacks dissolve these surfaces and create holes in the tooth (cavities). If left untreated, the acids destroy the other layers of the tooth.  RISK FACTORS  Frequent sipping of sugary beverages.   Frequent snacking on sugary and starchy foods, especially those that easily get stuck in the teeth.   Poor oral hygiene.   Dry mouth.   Substance abuse such as methamphetamine abuse.   Broken or poor-fitting dental restorations.   Eating disorders.   Gastroesophageal reflux disease (GERD).   Certain radiation treatments to the head and neck. SYMPTOMS In the early stages of dental caries, symptoms are seldom present. Sometimes white, chalky areas may be seen on the enamel or other tooth layers. In later stages, symptoms may include:  Pits and holes on the enamel.  Toothache after sweet, hot, or cold foods or drinks are consumed.  Pain around the tooth.  Swelling around the tooth. DIAGNOSIS  Most of the time, dental caries is detected  during a regular dental checkup. A diagnosis is made after a thorough medical and dental history is taken and the surfaces of your teeth are checked for signs of dental caries. Sometimes special instruments, such as lasers, are used to check for dental caries. Dental X-ray exams may be taken so that areas not visible to the eye (such as between the contact areas of the teeth) can be checked for cavities.  TREATMENT  If dental caries is in its early stages, it may be reversed with a fluoride treatment or an application of a remineralizing agent at the dental office. Thorough brushing and flossing at home is needed to aid these treatments. If it is in its later stages, treatment depends on the location and extent of tooth destruction:   If a small area of the tooth has been destroyed, the destroyed area will be removed and cavities will be filled with a material such as gold, silver amalgam, or composite resin.   If a large area of the tooth has been destroyed, the destroyed area will be removed and a cap (crown) will be fitted over the remaining tooth structure.   If the center part of the tooth (pulp) is affected, a procedure called a root canal will be needed before a filling or crown can be placed.   If most of the tooth has been destroyed, the tooth may need to be pulled (extracted). HOME CARE INSTRUCTIONS You can prevent, stop, or reverse dental caries at home by practicing good oral hygiene. Good oral hygiene includes:  Thoroughly cleaning your teeth at least twice a day with a toothbrush and dental floss.   Using a fluoride toothpaste. A fluoride mouth rinse may also be used if recommended by your dentist or health care provider.   Restricting the amount of sugary and starchy foods and sugary liquids you consume.   Avoiding frequent snacking on these foods and sipping of these liquids.   Keeping regular visits with a dentist for checkups and cleanings. PREVENTION   Practice good  oral hygiene.  Consider a dental sealant. A dental sealant is a coating material that is applied by your dentist to the pits and grooves of teeth. The sealant prevents food from being trapped in them. It may protect the teeth for several years.  Ask about fluoride supplements if you live in a community without fluorinated water or with water that has a low fluoride content. Use fluoride supplements as directed by your dentist or health care provider.  Allow fluoride varnish applications to teeth if directed by your dentist or health care provider. Document Released: 01/08/2002 Document Revised: 12/19/2012 Document Reviewed: 04/20/2012 [facility] Patient Information 2015 [place], [place]. This information is not intended to replace advice given to you by your health care provider. Make sure you discuss any questions you have with your health care provider.  Diet and Dental Disease What you eat affects the health of your teeth. Diet plays an important role in developing healthy teeth and preventing dental disease, such as:  Tooth decay.  Gum (periodontal) disease.  Developmental defects of the enamel. This is when visible surfaces of the tooth do not form properly, leaving the tooth more prone to decay.  Dental erosion. This is when the teeth wear away. Knowing which foods promote strong teeth and which foods  to stay away from can help you prevent poor oral health. If your diet lacks proper nutrients, it may be difficult for the tissues in your mouth to prevent dental disease. FOODS THAT PROMOTE DENTAL DISEASE The following foods either contain acids or create acid in your mouth that increases the risk of tooth decay:  Sugary foods, such as candy and baked goods (cookies, cake).  Soft drinks (carbonated and non-carbonated) such as soda, sports drinks, and fruit juice.  Citrus fruits, such as oranges and lemons.  Berries.  Honey.  Herbal teas that contain berries and other  fruits.  Wines and other alcoholic beverages.  Vinegar or vinegar containing foods, such as pickles.  Starchy snacks such as crackers, potato chips, [place] fries, and pasta. Some of these foods have health benefits. Eat these foods in moderation. The more often you eat these foods, the more frequently you are exposing your teeth to the acid that causes dental diseases. FOODS THAT REDUCE THE RISK OF DENTAL DISEASE Certain foods help to keep the teeth strong and reduce the risk of tooth decay. These foods include:  Dairy products, such as cow's milk and cheese. Eating dairy with a meal or sugary snack reduces the risk of tooth decay.  Gums and foods that substitute sugar with sorbitol, mannitol, and xylitol.  Fluoride containing foods, such as black tea. Fluoride is a natural mineral that protects the teeth from tooth decay. Your caregiver may recommend fluoride toothpaste or a fluoride supplement.  Breast milk. DIETARY RECOMMENDATIONS FOR HEALTHY TEETH  Eat a healthy, well-balanced diet with fiber-rich fruits and vegetables and quality proteins (eggs, meat, poultry, and fish). A variety of foods each day in moderation is best.  Avoid frequent sugary snacks in between meals.  Avoid frequent sticky, chewy, sugary candies, such as gummy bears and other candies that stick to the teeth. Avoid sucking on candies for a long time.  Avoid drinks that contain added sugar. Even though they do not sit in the mouth for very long, they can promote tooth decay if consumed too frequently.  Avoid sugary foods and drinks late at night.  Avoid swishing or holding acidic or sugary drinks in your mouth. Using a straw limits contact with the teeth.  If you like frequent sugary treats, try eating a sugary dessert after a meal or with a dairy product, rather than eating it by itself.  Avoid starchy foods such as graham crackers that stick to your teeth.  Eat highly acidic and sugary foods in moderation,  especially if you tend to develop tooth decay. Eat citrus fruits or drinks 2 times per day or less. Limit foods with vinegar and sports drinks to 1 time per week.  Try rinsing your mouth with water after a sugary or acidic meal or drink. Rinsing may help to reduce the acid buildup in the mouth.  Limit alcohol.  Read labels to determine the amount of sugar in foods. PRACTICE GOOD DAILY ORAL HYGIENE   Have your teeth professionally cleaned at the dentist every 6 months.  Brush twice daily with a fluoride toothpaste.  Floss between your teeth daily.  Ask your caregiver if you need fluoride supplements or treatments.  Ask your caregiver if you should have sealants applied to some of your teeth. HOME CARE INSTRUCTIONS  Follow the guidelines included here to promote good oral health.  Follow all of your caregiver's instructions for managing your health condition(s).  See your caregiver for follow-up exams as directed. Document Released: 12/15/2010 Document  Revised: 07/11/2011 Document Reviewed: 12/15/2010 [facility]Care Patient Information 2015 [place], [place]. This information is not intended to replace advice given to you by your health care provider. Make sure you discuss any questions you have with your health care provider.  Fatigue Fatigue is a feeling of tiredness, lack of energy, lack of motivation, or feeling tired all the time. Having enough rest, good nutrition, and reducing stress will normally reduce fatigue. Consult your caregiver if it persists. The nature of your fatigue will help your caregiver to find out its cause. The treatment is based on the cause.  CAUSES  There are many causes for fatigue. Most of the time, fatigue can be traced to one or more of your habits or routines. Most causes fit into one or more of three general areas. They are: Lifestyle problems  Sleep disturbances.  Overwork.  Physical exertion.  Unhealthy habits.  Poor eating habits or eating  disorders.  Alcohol and/or drug use .  Lack of proper nutrition (malnutrition). Psychological problems  Stress and/or anxiety problems.  Depression.  Grief.  Boredom. Medical Problems or Conditions  Anemia.  Pregnancy.  Thyroid gland problems.  Recovery from major surgery.  Continuous pain.  Emphysema or asthma that is not well controlled  Allergic conditions.  Diabetes.  Infections (such as mononucleosis).  Obesity.  Sleep disorders, such as sleep apnea.  Heart failure or other heart-related problems.  Cancer.  Kidney disease.  Liver disease.  Effects of certain medicines such as antihistamines, cough and cold remedies, prescription pain medicines, heart and blood pressure medicines, drugs used for treatment of cancer, and some antidepressants. SYMPTOMS  The symptoms of fatigue include:   Lack of energy.  Lack of drive (motivation).  Drowsiness.  Feeling of indifference to the surroundings. DIAGNOSIS  The details of how you feel help guide your caregiver in finding out what is causing the fatigue. You will be asked about your present and past health condition. It is important to review all medicines that you take, including prescription and non-prescription items. A thorough exam will be done. You will be questioned about your feelings, habits, and normal lifestyle. Your caregiver may suggest blood tests, urine tests, or other tests to look for common medical causes of fatigue.  TREATMENT  Fatigue is treated by correcting the underlying cause. For example, if you have continuous pain or depression, treating these causes will improve how you feel. Similarly, adjusting the dose of certain medicines will help in reducing fatigue.  HOME CARE INSTRUCTIONS   Try to get the required amount of good sleep every night.  Eat a healthy and nutritious diet, and drink enough water throughout the day.  Practice ways of relaxing (including yoga or  meditation).  Exercise regularly.  Make plans to change situations that cause stress. Act on those plans so that stresses decrease over time. Keep your work and personal routine reasonable.  Avoid street drugs and minimize use of alcohol.  Start taking a daily multivitamin after consulting your caregiver. SEEK MEDICAL CARE IF:   You have persistent tiredness, which cannot be accounted for.  You have fever.  You have unintentional weight loss.  You have headaches.  You have disturbed sleep throughout the night.  You are feeling sad.  You have constipation.  You have dry skin.  You have gained weight.  You are taking any new or different medicines that you suspect are causing fatigue.  You are unable to sleep at night.  You develop any unusual swelling of your  legs or other parts of your body. SEEK IMMEDIATE MEDICAL CARE IF:   You are feeling confused.  Your vision is blurred.  You feel faint or pass out.  You develop severe headache.  You develop severe abdominal, pelvic, or back pain.  You develop chest pain, shortness of breath, or an irregular or fast heartbeat.  You are unable to pass a normal amount of urine.  You develop abnormal bleeding such as bleeding from the rectum or you vomit blood.  You have thoughts about harming yourself or committing suicide.  You are worried that you might harm someone else. MAKE SURE YOU:   Understand these instructions.  Will watch your condition.  Will get help right away if you are not doing well or get worse. Document Released: 02/13/2007 Document Revised: 07/11/2011 Document Reviewed: 02/13/2007 [facility] Patient Information 2015 [place], [place]. This information is not intended to replace advice given to you by your health care provider. Make sure you discuss any questions you have with your health care provider.

## 2013-11-05 NOTE — ED Notes (Signed)
States that she has no appetite even when she does not have an abscess, pt has lost over 70 lbs. States that she feels weak now and is concerned regarding the weakness. States that she only eats breakfast.

## 2017-02-23 ENCOUNTER — Emergency Department (HOSPITAL_COMMUNITY)
Admission: EM | Admit: 2017-02-23 | Discharge: 2017-02-23 | Disposition: A | Discharge status: Home / Self Care | Payer: Self-pay | Attending: Emergency Medicine | Admitting: Emergency Medicine

## 2017-02-23 ENCOUNTER — Encounter (HOSPITAL_COMMUNITY): Payer: Self-pay

## 2017-02-23 DIAGNOSIS — J45909 Unspecified asthma, uncomplicated: Secondary | ICD-10-CM | POA: Insufficient documentation

## 2017-02-23 DIAGNOSIS — F17210 Nicotine dependence, cigarettes, uncomplicated: Secondary | ICD-10-CM | POA: Insufficient documentation

## 2017-02-23 DIAGNOSIS — K047 Periapical abscess without sinus: Secondary | ICD-10-CM | POA: Insufficient documentation

## 2017-02-23 MED ORDER — MAGIC MOUTHWASH W/LIDOCAINE: 5.0000 mL | Freq: Three times a day (TID) | ORAL | 0 refills | Status: DC

## 2017-02-23 MED ORDER — PENICILLIN V POTASSIUM 500 MG PO TABS: 500.0000 mg | ORAL_TABLET | Freq: Four times a day (QID) | ORAL | 0 refills | Status: DC

## 2017-02-23 MED ORDER — AMOXICILLIN 500 MG PO CAPS: 500.0000 mg | ORAL_CAPSULE | Freq: Three times a day (TID) | ORAL | 0 refills | Status: DC

## 2017-02-23 MED ORDER — PENICILLIN V POTASSIUM 500 MG PO TABS: 500.0000 mg | ORAL_TABLET | Freq: Three times a day (TID) | ORAL | 0 refills | Status: AC

## 2017-02-23 NOTE — ED Provider Notes (Signed)
[facility] EMERGENCY DEPARTMENT Provider Note   CSN: [id number] Arrival date & time: 02/23/17  1313  History   Chief Complaint Chief Complaint  Patient presents with  . dental problem/abscess    HPI Audrey Ramirez is a 51 y.o. female.  HPI   Patient comes to the ER for evaluation for right upper jaw/gum pain for 2-3 weeks. She has noticed pus draining from the area as well. She has been rinsing with salt water which has provided her some relief but now has made it worse. She reports using "1 box of salt a day and peroxide" . She has also taken some over the counter mediations that work for a short time but then the pain comes back. She has not had any fevers, nausea, vomiting, diarrhea, neck pain headaches, confusion, difficulty swallowing of swelling under her tongue.  Past Medical History:  Diagnosis Date  . Asthma     There are no active problems to display for this patient.   History reviewed. No pertinent surgical history.  OB History    No data available       Home Medications    Prior to Admission medications   Medication Sig Start Date End Date Taking? Authorizing Provider  amoxicillin (AMOXIL) 500 MG capsule Take 1 capsule (500 mg total) by mouth 3 (three) times daily. 02/23/17   Neva Seat, Sollie Vultaggio, PA-C  diphenhydramine-acetaminophen (TYLENOL PM) 25-500 MG TABS Take 1 tablet by mouth at bedtime as needed (for pain / sleep).     [provider]  magic mouthwash w/lidocaine SOLN Take 5 mLs by mouth 3 (three) times daily. 02/23/17   Marlon Pel, PA-C  penicillin v potassium (VEETID) 500 MG tablet Take 1 tablet (500 mg total) by mouth 3 (three) times daily. 02/23/17 03/02/17  Marlon Pel, PA-C    Family History No family history on file.  Social History Social History  Substance Use Topics  . Smoking status: Current Every Day Smoker    Packs/day: 0.50    Years: 25.00    Types: Cigarettes  . Smokeless tobacco: Not on  file  . Alcohol use 1.2 oz/week    2 Cans of beer per week     Allergies   Patient has no known allergies.   Review of Systems Review of Systems Negative ROS aside from pertinent positives and negatives as listed in HPI   Physical Exam Updated Vital Signs BP 122/86   Pulse 95   Temp 98.5 F (36.9 C) (Oral)   Resp 18   SpO2 100%   Physical Exam  Constitutional: She appears well-developed and well-nourished. No distress.  HENT:  Head: Normocephalic and atraumatic.  Mouth/Throat: Uvula is midline, oropharynx is clear and moist and mucous membranes are normal. Normal dentition. Dental caries (Pts tooth shows no obvious abscess but moderate to severe tenderness to palpation of marked tooth) present. No uvula swelling.    No symptoms consistent with ludwigs angina. She has irritation to her gum lining.   Eyes: Pupils are equal, round, and reactive to light.  Neck: Trachea normal, normal range of motion and full passive range of motion without pain. Neck supple.  Cardiovascular: Normal rate, regular rhythm, normal heart sounds and normal pulses.   Pulmonary/Chest: Effort normal and breath sounds normal. No respiratory distress. Chest wall is not dull to percussion. She exhibits no tenderness, no crepitus, no edema, no deformity and no retraction.  Abdominal: Normal appearance.  Musculoskeletal: Normal range of motion.  Neurological: She is  alert. She has normal strength.  Skin: Skin is warm, dry and intact. She is not diaphoretic.  Psychiatric: She has a normal mood and affect. Her speech is normal. Cognition and memory are normal.     ED Treatments / Results  Labs (all labs ordered are listed, but only abnormal results are displayed) Labs Reviewed - No data to display  EKG  EKG Interpretation None       Radiology No results found.  Procedures Procedures (including critical care time)  Medications Ordered in ED Medications - No data to display   Initial  Impression / Assessment and Plan / ED Course  I have reviewed the triage vital signs and the nursing notes.  Pertinent labs & imaging results that were available during my care of the patient were reviewed by me and considered in my medical decision making (see chart for details).   Dental pain associated with dental infection and possible dental abscess with patient afebrile, non toxic appearing and swallowing secretions well. I gave patient referral to dentist and stressed the importance of dental follow up for ultimate management of dental pain. I have also discussed reasons to return immediately to the ER.  Patient expresses understanding and agrees with plan.  I will also give doxycycline and pain control.    Final Clinical Impressions(s) / ED Diagnoses   Final diagnoses:  Dental abscess    New Prescriptions New Prescriptions   AMOXICILLIN (AMOXIL) 500 MG CAPSULE    Take 1 capsule (500 mg total) by mouth 3 (three) times daily.   MAGIC MOUTHWASH W/LIDOCAINE SOLN    Take 5 mLs by mouth 3 (three) times daily.   PENICILLIN V POTASSIUM (VEETID) 500 MG TABLET    Take 1 tablet (500 mg total) by mouth 3 (three) times daily.     Marlon PelGreene, Safire Gordin, PA-C 02/23/17 1612    Marlon PelGreene, Cathy Ropp, PA-C 02/23/17 1648    Gerhard MunchLockwood, Robert, MD 02/23/17 [phone]

## 2017-02-23 NOTE — ED Triage Notes (Signed)
Patient here with right upper jaw/gum pain for 2-3 weeks, states that she has pus draining intermittently from gum. Patient rinsing with salt water with some relief.

## 2017-05-14 ENCOUNTER — Encounter (HOSPITAL_COMMUNITY): Payer: Self-pay | Admitting: Emergency Medicine

## 2017-05-14 ENCOUNTER — Emergency Department (HOSPITAL_COMMUNITY)
Admission: EM | Admit: 2017-05-14 | Discharge: 2017-05-14 | Disposition: A | Discharge status: Home / Self Care | Payer: Self-pay | Attending: Emergency Medicine | Admitting: Emergency Medicine

## 2017-05-14 DIAGNOSIS — K047 Periapical abscess without sinus: Secondary | ICD-10-CM | POA: Insufficient documentation

## 2017-05-14 DIAGNOSIS — J45909 Unspecified asthma, uncomplicated: Secondary | ICD-10-CM | POA: Insufficient documentation

## 2017-05-14 DIAGNOSIS — F17210 Nicotine dependence, cigarettes, uncomplicated: Secondary | ICD-10-CM | POA: Insufficient documentation

## 2017-05-14 MED ORDER — AMOXICILLIN 500 MG PO CAPS: 500.0000 mg | ORAL_CAPSULE | Freq: Three times a day (TID) | ORAL | 0 refills | Status: DC

## 2017-05-14 MED ORDER — MAGIC MOUTHWASH W/LIDOCAINE: 5.0000 mL | Freq: Three times a day (TID) | ORAL | 0 refills | Status: DC | PRN

## 2017-05-14 MED ORDER — AMOXICILLIN 500 MG PO CAPS
500.0000 mg | ORAL_CAPSULE | Freq: Once | ORAL | Status: AC
  Administered 2017-05-14: 500 mg via ORAL
  Filled 2017-05-14: qty 1

## 2017-05-14 MED ORDER — HYDROCODONE-ACETAMINOPHEN 5-325 MG PO TABS
1.0000 | ORAL_TABLET | Freq: Once | ORAL | Status: AC
  Administered 2017-05-14: 1 via ORAL
  Filled 2017-05-14: qty 1

## 2017-05-14 MED ORDER — NAPROXEN 375 MG PO TABS: 375.0000 mg | ORAL_TABLET | Freq: Two times a day (BID) | ORAL | 0 refills | Status: DC

## 2017-05-14 NOTE — ED Provider Notes (Signed)
[facility] EMERGENCY DEPARTMENT Provider Note   [id number] Arrival date & time: 05/14/17  2001     History   Chief Complaint Chief Complaint  Patient presents with  . FACIAL PAIN    HPI Audrey Ramirez is a 52 y.o. female who presents to the ED with dental pain and facial swelling. The pain started 5 days ago. Patient reports that she stopped smoking and has been eating candy to try not to smoke. Now she is having dental problems. She reports facial swelling today. The pain is in the left upper dental area.   HPI  Past Medical History:  Diagnosis Date  . Asthma     There are no active problems to display for this patient.   History reviewed. No pertinent surgical history.  OB History    No data available       Home Medications    Prior to Admission medications   Medication Sig Start Date End Date Taking? Authorizing Provider  amoxicillin (AMOXIL) 500 MG capsule Take 1 capsule (500 mg total) by mouth 3 (three) times daily. 05/14/17   Janne NapoleonNeese, Hope M, NP  magic mouthwash w/lidocaine SOLN Take 5 mLs by mouth 3 (three) times daily as needed for mouth pain. 05/14/17   Janne NapoleonNeese, Hope M, NP  naproxen (NAPROSYN) 375 MG tablet Take 1 tablet (375 mg total) by mouth 2 (two) times daily. 05/14/17   Janne NapoleonNeese, Hope M, NP    Family History No family history on file.  Social History Social History   Tobacco Use  . Smoking status: Current Every Day Smoker    Packs/day: 0.50    Years: 25.00    Pack years: 12.50    Types: Cigarettes  . Smokeless tobacco: Never Used  Substance Use Topics  . Alcohol use: Yes    Alcohol/week: 1.2 oz    Types: 2 Cans of beer per week  . Drug use: No     Allergies   Patient has no known allergies.   Review of Systems Review of Systems  Constitutional: Negative for chills and fever.  HENT: Positive for dental problem and facial swelling.   Eyes: Negative for discharge and redness.  Respiratory: Negative for shortness  of breath.   Cardiovascular: Negative for chest pain.  Gastrointestinal: Negative for nausea and vomiting.  Musculoskeletal: Negative for neck stiffness.  Skin: Negative for wound.  Neurological: Negative for headaches.  Hematological: Negative for adenopathy.     Physical Exam Updated Vital Signs BP (!) 148/92 (BP Location: Right Arm)   Pulse 91   Temp 98.8 F (37.1 C) (Oral)   Resp 18   SpO2 100%   Physical Exam  Constitutional: She appears well-developed and well-nourished. No distress.  HENT:  Nose: Nose normal.  Mouth/Throat: Uvula is midline. Dental abscesses and dental caries present.  Facial swelling left. There is swelling of the gum of the left upper dental area with decayed teeth.   Eyes: EOM are normal.  Neck: Neck supple.  Cardiovascular: Normal rate and regular rhythm.  Pulmonary/Chest: Effort normal and breath sounds normal.  Musculoskeletal: Normal range of motion.  Neurological: She is alert.  Skin: Skin is warm and dry.  Psychiatric: She has a normal mood and affect. Her behavior is normal.  Nursing note and vitals reviewed.    ED Treatments / Results  Labs (all labs ordered are listed, but only abnormal results are displayed) Labs Reviewed - No data to display  Radiology No results found.  Procedures Procedures (including critical care time)  Medications Ordered in ED Medications  amoxicillin (AMOXIL) capsule 500 mg (500 mg Oral Given 05/14/17 2048)  HYDROcodone-acetaminophen (NORCO/VICODIN) 5-325 MG per tablet 1 tablet (1 tablet Oral Given 05/14/17 2048)     Initial Impression / Assessment and Plan / ED Course  I have reviewed the triage vital signs and the nursing notes. Patient with toothache. There is facial swelling and small abscess noted on the left upper dental area.  Exam unconcerning for Ludwig's angina or spread of infection.  Will treat with penicillin and anti-inflammatories medicine.  Urged patient to follow-up with dentist.     Final Clinical Impressions(s) / ED Diagnoses   Final diagnoses:  Dental abscess    ED Discharge Orders        Ordered    amoxicillin (AMOXIL) 500 MG capsule  3 times daily     05/14/17 2048    naproxen (NAPROSYN) 375 MG tablet  2 times daily     05/14/17 2048    magic mouthwash w/lidocaine SOLN  3 times daily PRN     05/14/17 2048       Kerrie [place], NP 05/14/17 2053    Mancel Bale, MD 05/15/17 [phone]

## 2017-05-14 NOTE — Discharge Instructions (Signed)
Follow up with a dentist as soon as possible. Return here for worsening symptoms.  °

## 2017-05-14 NOTE — ED Notes (Signed)
ED Provider at bedside. 

## 2017-05-14 NOTE — ED Triage Notes (Signed)
Pt c/o L sided facial pain related to dental pain L upper molars. Pt states she is trying to quit smoking and has been eating candy to stop smoking. Slight swelling noted to L face.

## 2017-05-14 NOTE — ED Notes (Signed)
Pt understood dc material. NAD noted. Scripts given at dc 

## 2017-05-16 ENCOUNTER — Telehealth: Payer: Self-pay | Admitting: *Deleted

## 2017-05-16 NOTE — Telephone Encounter (Signed)
Pharmacy called for Science Applications InternationalMagic Mouthwash ingredients.  Magic Mouthwash:  130ml Maalox Extra Strength; 15 ml Nystatin Suspension; 75 ml Benadryl; 40ml 2% Lidocaine.

## 2018-03-07 ENCOUNTER — Other Ambulatory Visit: Payer: Self-pay

## 2018-03-07 ENCOUNTER — Encounter (HOSPITAL_COMMUNITY): Payer: Self-pay | Admitting: Emergency Medicine

## 2018-03-07 ENCOUNTER — Emergency Department (HOSPITAL_COMMUNITY)
Admission: EM | Admit: 2018-03-07 | Discharge: 2018-03-08 | Disposition: A | Discharge status: Home / Self Care | Payer: Self-pay | Attending: Emergency Medicine | Admitting: Emergency Medicine

## 2018-03-07 DIAGNOSIS — R059 Cough, unspecified: Secondary | ICD-10-CM

## 2018-03-07 DIAGNOSIS — K0889 Other specified disorders of teeth and supporting structures: Secondary | ICD-10-CM | POA: Insufficient documentation

## 2018-03-07 DIAGNOSIS — R05 Cough: Secondary | ICD-10-CM | POA: Insufficient documentation

## 2018-03-07 DIAGNOSIS — Z79899 Other long term (current) drug therapy: Secondary | ICD-10-CM | POA: Insufficient documentation

## 2018-03-07 DIAGNOSIS — J45909 Unspecified asthma, uncomplicated: Secondary | ICD-10-CM | POA: Insufficient documentation

## 2018-03-07 DIAGNOSIS — F17210 Nicotine dependence, cigarettes, uncomplicated: Secondary | ICD-10-CM | POA: Insufficient documentation

## 2018-03-07 NOTE — ED Triage Notes (Signed)
Pt c/o dental abscess, mild fevers at home and generalized fever after having a flu shot 1 mos ago.

## 2018-03-08 ENCOUNTER — Telehealth: Payer: Self-pay | Admitting: *Deleted

## 2018-03-08 ENCOUNTER — Emergency Department (HOSPITAL_COMMUNITY): Payer: Self-pay

## 2018-03-08 LAB — COMPREHENSIVE METABOLIC PANEL
ALBUMIN: 3.6 g/dL (ref 3.5–5.0)
ALT: 19 U/L (ref 0–44)
AST: 20 U/L (ref 15–41)
Alkaline Phosphatase: 48 U/L (ref 38–126)
Anion gap: 9 (ref 5–15)
BUN: 12 mg/dL (ref 6–20)
CALCIUM: 8.8 mg/dL — AB (ref 8.9–10.3)
CHLORIDE: 109 mmol/L (ref 98–111)
CO2: 22 mmol/L (ref 22–32)
CREATININE: 0.79 mg/dL (ref 0.44–1.00)
Glucose, Bld: 111 mg/dL — ABNORMAL HIGH (ref 70–99)
Potassium: 4.1 mmol/L (ref 3.5–5.1)
SODIUM: 140 mmol/L (ref 135–145)
TOTAL PROTEIN: 6.5 g/dL (ref 6.5–8.1)
Total Bilirubin: 0.4 mg/dL (ref 0.3–1.2)

## 2018-03-08 LAB — CBC WITH DIFFERENTIAL/PLATELET
Abs Immature Granulocytes: 0.07 10*3/uL (ref 0.00–0.07)
BASOS ABS: 0.1 10*3/uL (ref 0.0–0.1)
Basophils Relative: 0 %
Eosinophils Absolute: 0.4 10*3/uL (ref 0.0–0.5)
Eosinophils Relative: 3 %
HEMATOCRIT: 39 % (ref 36.0–46.0)
Hemoglobin: 12.1 g/dL (ref 12.0–15.0)
IMMATURE GRANULOCYTES: 1 %
LYMPHS ABS: 5.8 10*3/uL — AB (ref 0.7–4.0)
Lymphocytes Relative: 47 %
MCH: 27.6 pg (ref 26.0–34.0)
MCHC: 31 g/dL (ref 30.0–36.0)
MCV: 89 fL (ref 80.0–100.0)
Monocytes Absolute: 1 10*3/uL (ref 0.1–1.0)
Monocytes Relative: 8 %
NEUTROS PCT: 41 %
NRBC: 0 % (ref 0.0–0.2)
Neutro Abs: 5.2 10*3/uL (ref 1.7–7.7)
PLATELETS: 320 10*3/uL (ref 150–400)
RBC: 4.38 MIL/uL (ref 3.87–5.11)
RDW: 13.2 % (ref 11.5–15.5)
WBC: 12.5 10*3/uL — AB (ref 4.0–10.5)

## 2018-03-08 MED ORDER — MAGIC MOUTHWASH W/LIDOCAINE: 5.0000 mL | Freq: Three times a day (TID) | ORAL | 0 refills | Status: DC | PRN

## 2018-03-08 MED ORDER — AMOXICILLIN 500 MG PO CAPS: 500.0000 mg | ORAL_CAPSULE | Freq: Three times a day (TID) | ORAL | 0 refills | Status: DC

## 2018-03-08 MED ORDER — NAPROXEN 375 MG PO TABS: 375.0000 mg | ORAL_TABLET | Freq: Two times a day (BID) | ORAL | 0 refills | Status: DC

## 2018-03-08 MED ORDER — ALBUTEROL SULFATE HFA 108 (90 BASE) MCG/ACT IN AERS: 1.0000 | INHALATION_SPRAY | Freq: Four times a day (QID) | RESPIRATORY_TRACT | 0 refills | Status: DC | PRN

## 2018-03-08 NOTE — Telephone Encounter (Signed)
Pharmacy called related to Rx: magic mouthwash ratio .Marland KitchenMarland KitchenEDCM clarified with ED Pharmacy:  Maalox 10ml, Nystatin 5ml, Benadryl 30ml, 2%Lidcaine 15ml.

## 2018-03-08 NOTE — Discharge Instructions (Addendum)
Today you were seen for dental pain and fatigue. We have reviewed your labs and images, and it is all within normal limits. You were prescribed Amoxicillin 500mg  TID for your dental pain. You were also prescribed magic mouthwash with lidocaine to help your dental pain. Take Naproxen 1 tablet by mouth two times daily as needed for dental pain. Continue taking ibuprofen as need for the dental pain. Please return to the ER if symptoms do not improve or worsen.    Dental Assistance If the dentist on-call cannot see you, please use the resources below:   Patients with Medicaid: [facility] [phone] [address], [phone] [phone]  If unable to pay, or uninsured, contact [facility] [phone]) or [facility] Department [phone] in [place], [phone] in [facility]) to become qualified for the adult dental clinic  Other Low-Cost Community Dental Services: [facility]- [address]asha [address]ence [place], [place], [address]    [phone], Ext. [phone]    2nd and 4th Thursday of the month at 6:30am    10 clients each day by appointment, can sometimes see walk-in     patients if someone does not show for an appointment [facility]- [address] [place], [place], [address]    [phone] [facility] [address] [place], [place], [address]    [phone]  [facility] Department- [phone] [facility] Department- [phone] [facility] Department- [phone]

## 2018-03-08 NOTE — ED Provider Notes (Signed)
[facility] EMERGENCY DEPARTMENT Provider Note   CSN: [id number] Arrival date & time: 03/07/18  2339     History   Chief Complaint Chief Complaint  Patient presents with  . Dental Pain    HPI Audrey Ramirez is a 52 y.o. female presenting with dental pain onset 3 weeks ago. Patient describes dental pain is located in the left side of her mouth. Patient states she has had multiple dental abscess in the past and has had a few teeth pulled. Patient describes pain is episodic, but it has been a lot worse today. Patient has tried ibuprofen with partial relief. Patient reports she is concerned that she has also been feeling fatigued since her flu shot 1 month ago. Patient reports episodic subjective fevers, shortness of breath, and cough since her flu shot. Patient states she has had a history of pneumonia with similar symptoms. Patient denies chest pain, abdominal pain, nausea, or vomiting.   HPI  Past Medical History:  Diagnosis Date  . Asthma     There are no active problems to display for this patient.   History reviewed. No pertinent surgical history.   OB History   None      Home Medications    Prior to Admission medications   Medication Sig Start Date End Date Taking? Authorizing Provider  albuterol (PROVENTIL HFA;VENTOLIN HFA) 108 (90 Base) MCG/ACT inhaler Inhale 1-2 puffs into the lungs every 6 (six) hours as needed for wheezing or shortness of breath. 03/08/18   Carlyle Basques P, PA-C  amoxicillin (AMOXIL) 500 MG capsule Take 1 capsule (500 mg total) by mouth 3 (three) times daily. 03/08/18   Carlyle Basques P, PA-C  magic mouthwash w/lidocaine SOLN Take 5 mLs by mouth 3 (three) times daily as needed for mouth pain. 03/08/18   Carlyle Basques P, PA-C  naproxen (NAPROSYN) 375 MG tablet Take 1 tablet (375 mg total) by mouth 2 (two) times daily. 03/08/18   Leretha Dykes, PA-C    Family History No family history on file.  Social History Social  History   Tobacco Use  . Smoking status: Current Every Day Smoker    Packs/day: 0.50    Years: 25.00    Pack years: 12.50    Types: Cigarettes  . Smokeless tobacco: Never Used  Substance Use Topics  . Alcohol use: Yes    Alcohol/week: 2.0 standard drinks    Types: 2 Cans of beer per week  . Drug use: No     Allergies   Patient has no known allergies.   Review of Systems Review of Systems  Constitutional: Positive for appetite change, fatigue and fever (Pt reports subjective fever.). Negative for activity change, chills and diaphoresis.  HENT: Positive for dental problem and facial swelling (Pt reports slight facial swelling over her left upper lip. ). Negative for ear pain and trouble swallowing.   Respiratory: Positive for cough and shortness of breath. Negative for chest tightness.   Cardiovascular: Negative for chest pain.  Gastrointestinal: Negative for abdominal pain, diarrhea, nausea and vomiting.  Musculoskeletal: Negative for myalgias, neck pain and neck stiffness.  Skin: Negative for rash.  Allergic/Immunologic: Negative for environmental allergies and immunocompromised state.  Neurological: Positive for weakness (Pt reports diffuse weakness.). Negative for dizziness and headaches.  Psychiatric/Behavioral: The patient is not nervous/anxious.      Physical Exam Updated Vital Signs BP (!) 136/101 (BP Location: Right Arm)   Pulse 85   Temp 98.2 F (36.8 C) (Oral)  Resp 16   SpO2 100%   Physical Exam  Constitutional: She appears well-developed and well-nourished. No distress.  HENT:  Head: Normocephalic and atraumatic.  Right Ear: Tympanic membrane normal. No middle ear effusion.  Left Ear: Tympanic membrane normal.  No middle ear effusion.  Nose: Nose normal.  Mouth/Throat: Uvula is midline, oropharynx is clear and moist and mucous membranes are normal. Abnormal dentition. Dental caries present. No dental abscesses. No oropharyngeal exudate or posterior  oropharyngeal edema.  No signs of Ludwig's angina. No hot potato or muffled voice noted. Patient is able to swallow without difficulty. Erythema and mild edema noted on left side of the roof of the mouth behind tooth.   Eyes: Conjunctivae are normal. Right eye exhibits no discharge. Left eye exhibits no discharge.  Neck: Normal range of motion. Neck supple.  Cardiovascular: Normal rate, regular rhythm and normal heart sounds. Exam reveals no gallop and no friction rub.  No murmur heard. Pulmonary/Chest: Effort normal and breath sounds normal. No respiratory distress. She has no wheezes. She has no rales.  Abdominal: Soft. There is no tenderness.  Musculoskeletal: Normal range of motion.  Lymphadenopathy:    She has no cervical adenopathy.  Neurological: She is alert.  Skin: She is not diaphoretic. No erythema.  Psychiatric: She has a normal mood and affect.  Nursing note and vitals reviewed.    ED Treatments / Results  Labs (all labs ordered are listed, but only abnormal results are displayed) Labs Reviewed  CBC WITH DIFFERENTIAL/PLATELET - Abnormal; Notable for the following components:      Result Value   WBC 12.5 (*)    Lymphs Abs 5.8 (*)    All other components within normal limits  COMPREHENSIVE METABOLIC PANEL - Abnormal; Notable for the following components:   Glucose, Bld 111 (*)    Calcium 8.8 (*)    All other components within normal limits   Labs are within normal limits.  EKG None  Radiology chest X-ray is normal. I have personally viewed and interpreted the imaging and agree with radiologist interpretation.  Procedures Procedures (including critical care time)  Medications Ordered in ED Medications - No data to display   Initial Impression / Assessment and Plan / ED Course  I have reviewed the triage vital signs and the nursing notes.  Pertinent labs & imaging results that were available during my care of the patient were reviewed by me and considered  in my medical decision making (see chart for details).  Patient with toothache.  No gross abscess.  Exam unconcerning for Ludwig's angina or spread of infection.   Ordered CXR due to cough, shortness of breath, and fatigue as well as history of pneumonia. Ordered CMP and CBC to determine if anemia or an electrolyte abnormality may be causing fatigue. All labs within normal limits.   Will provide Amoxicillin, magic mouthwash with lidocaine, and naproxen for dental pain.  Will provide albuterol inhaler for chronic cough. Instructed patient to follow up with dentist and provided resources.  Final Clinical Impressions(s) / ED Diagnoses   Final diagnoses:  Pain, dental  Cough in adult    ED Discharge Orders         Ordered    amoxicillin (AMOXIL) 500 MG capsule  3 times daily     03/08/18 0056    naproxen (NAPROSYN) 375 MG tablet  2 times daily     03/08/18 0056    magic mouthwash w/lidocaine SOLN  3 times daily PRN  03/08/18 0056    albuterol (PROVENTIL HFA;VENTOLIN HFA) 108 (90 Base) MCG/ACT inhaler  Every 6 hours PRN     03/08/18 0056          Leretha Dykes, PA-C 03/08/18 0142  Nira Conn, MD 03/08/18 [phone]

## 2018-03-08 NOTE — ED Notes (Signed)
Patient verbalizes understanding of discharge instructions. Opportunity for questioning and answers were provided. Armband removed by staff, pt discharged from ED, ambulatory to lobby.  

## 2019-03-17 IMAGING — DX DG CHEST 2V
2 series · 2 of 2 positions shown · non-contrast
Comparison: 07/17/2011

CLINICAL DATA: Dental abscess. Fever at home after a flu shot 1
month ago. Cough.

EXAM:
CHEST - 2 VIEW

[chest pa]
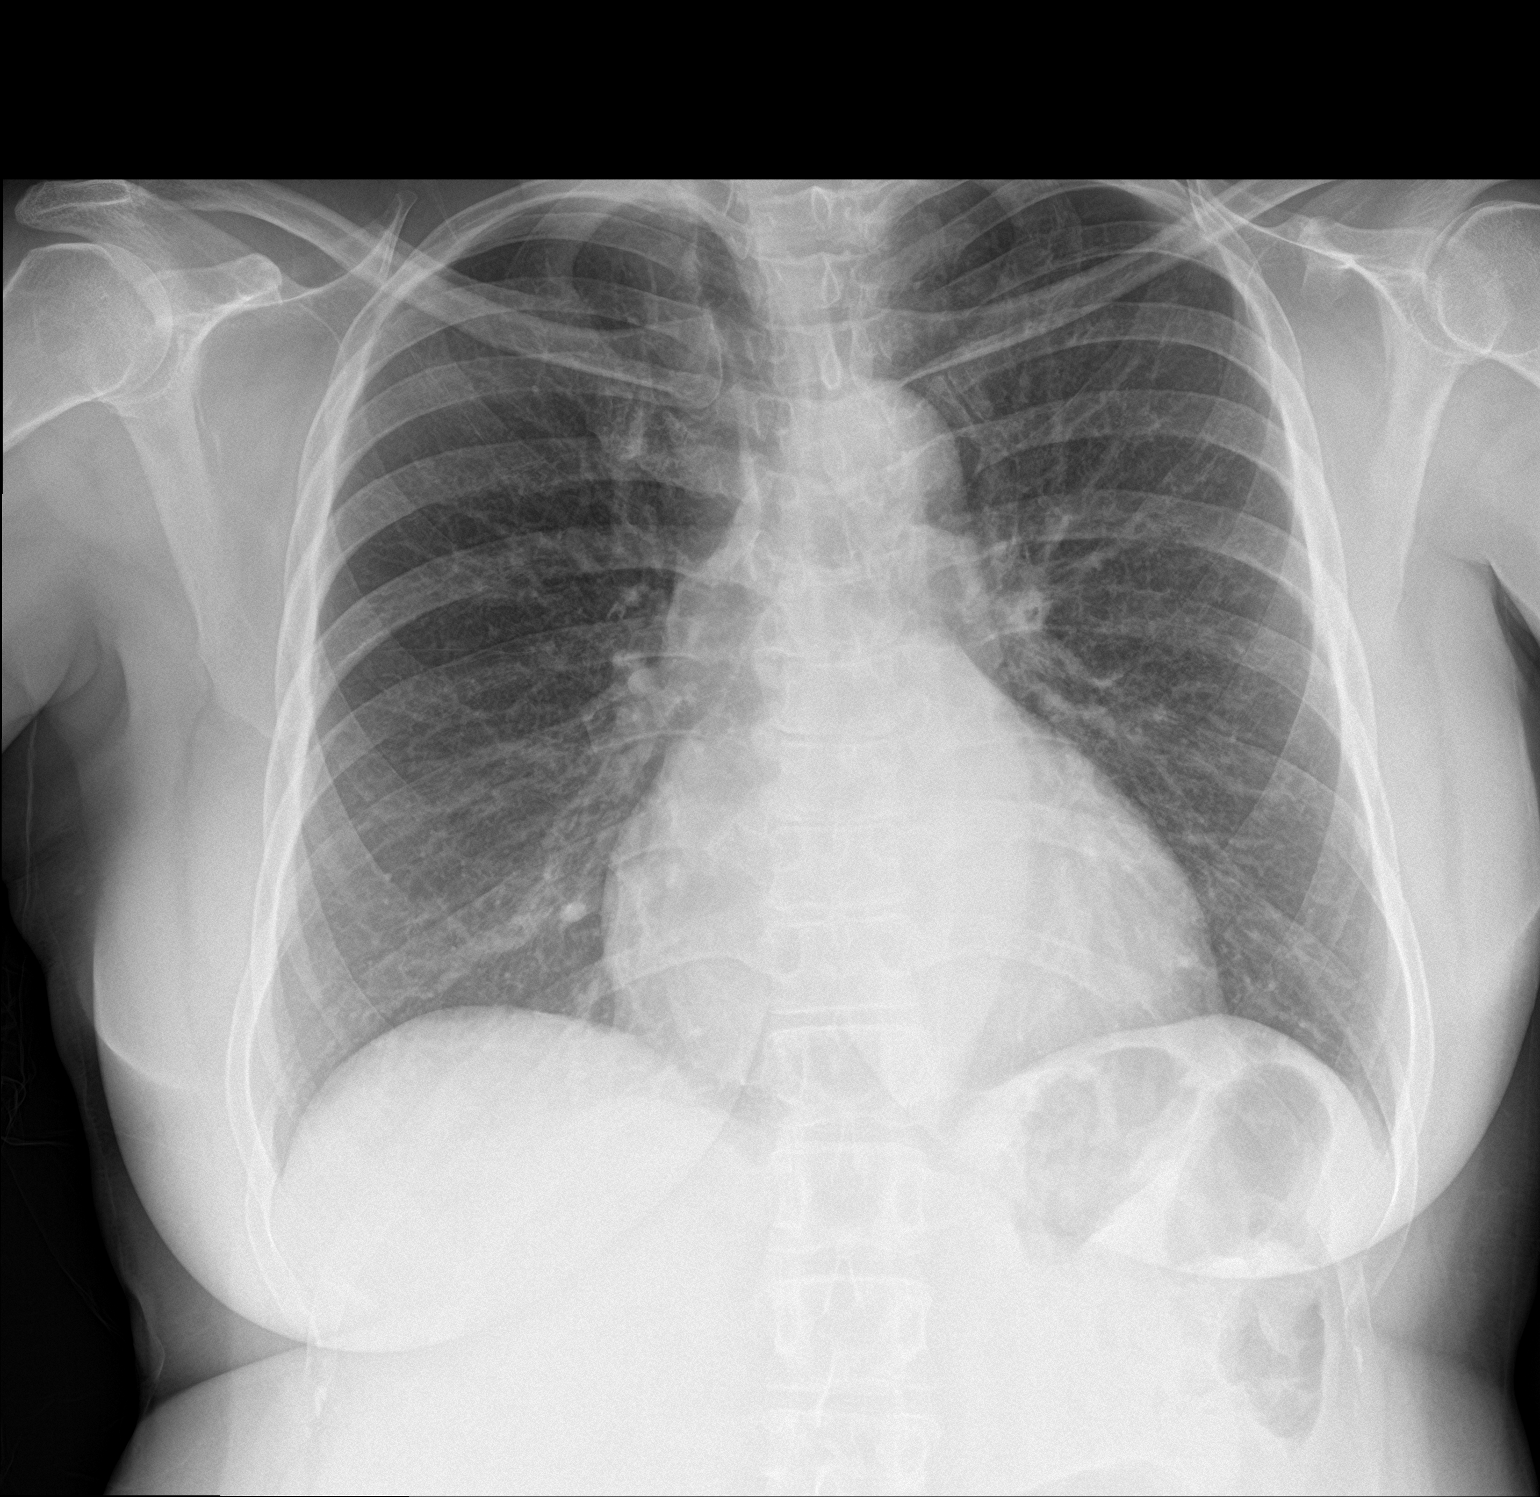

[chest lat]
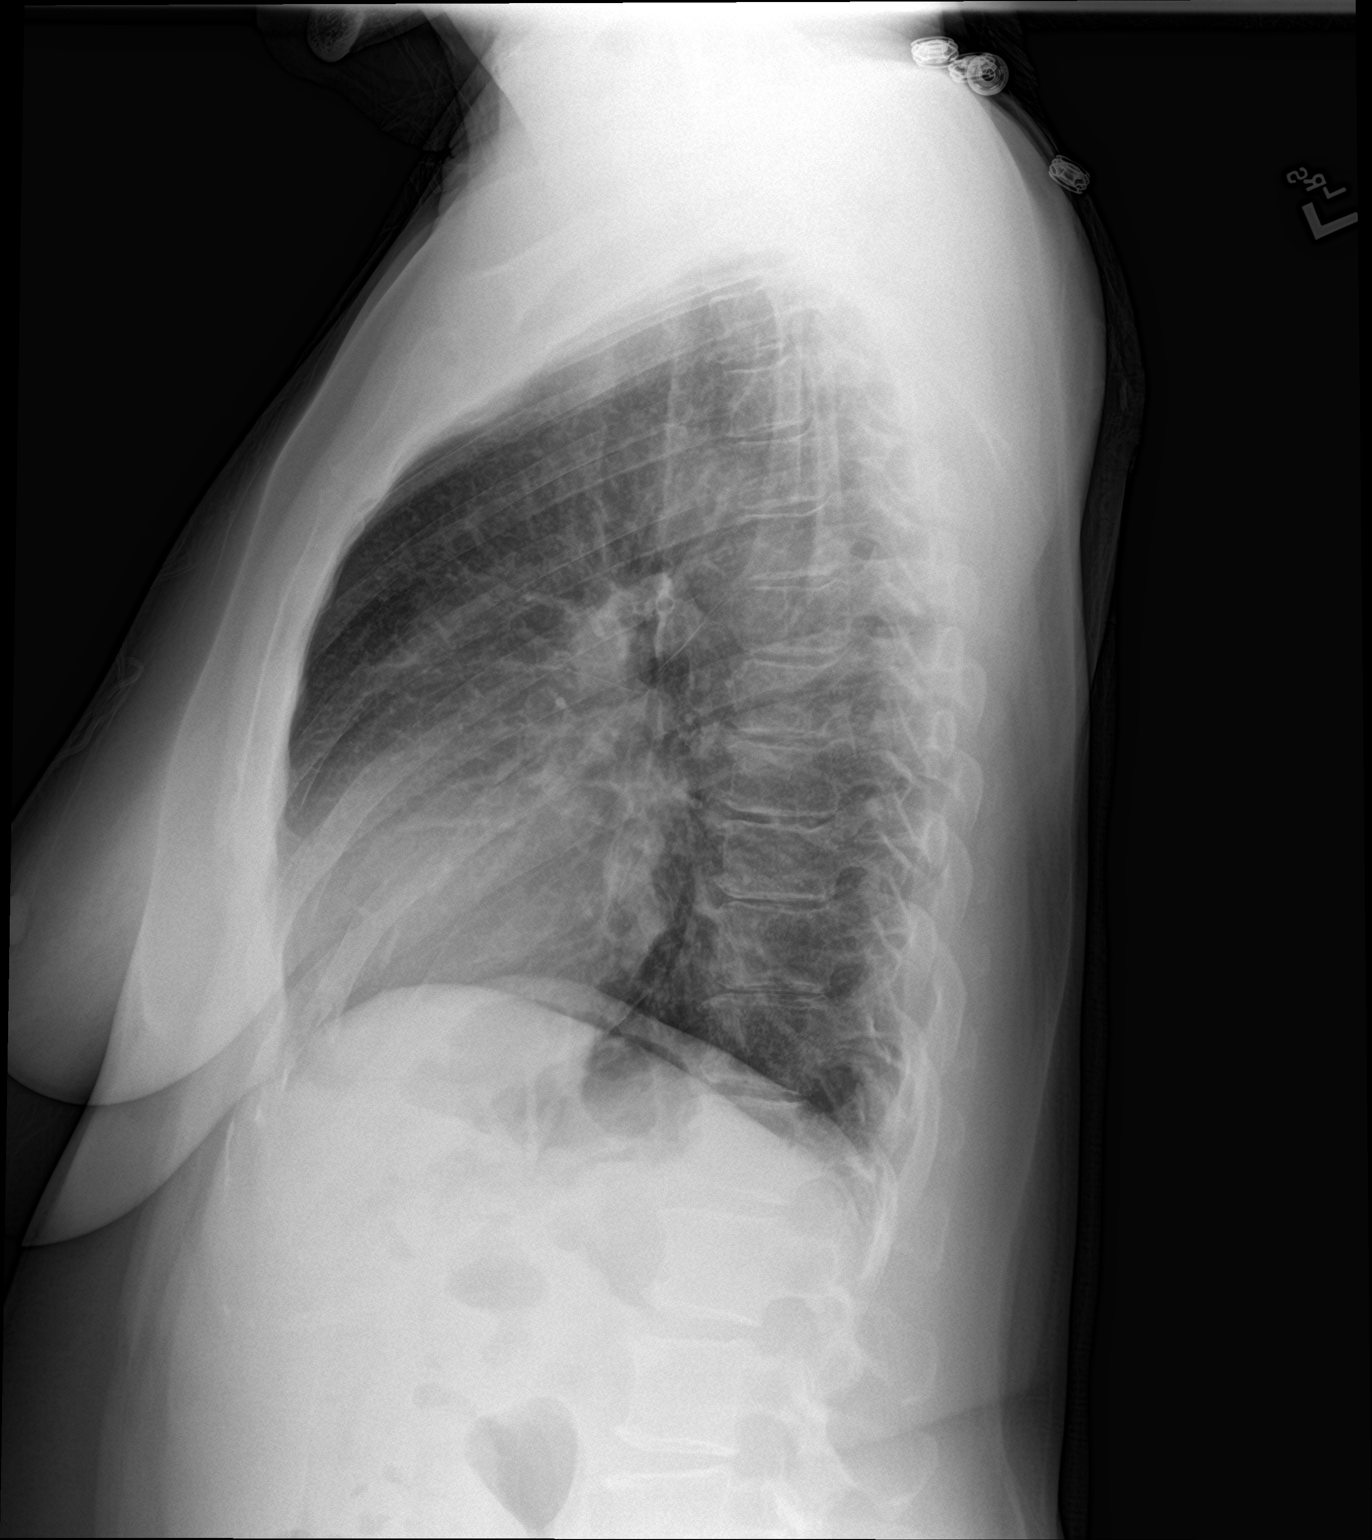

[2 of 2 positions shown; findings below may reference images not displayed]

FINDINGS: The heart size and mediastinal contours are within normal limits.
Both lungs are clear. The visualized skeletal structures are
unremarkable.
IMPRESSION: No active cardiopulmonary disease.

## 2019-09-26 ENCOUNTER — Other Ambulatory Visit (HOSPITAL_COMMUNITY)
Admission: RE | Admit: 2019-09-26 | Discharge: 2019-09-26 | Disposition: A | Discharge status: Home / Self Care | Payer: BC Managed Care – PPO | Source: Ambulatory Visit | Attending: Family Medicine | Admitting: Family Medicine

## 2019-09-26 ENCOUNTER — Other Ambulatory Visit: Payer: Self-pay | Admitting: Family Medicine

## 2019-09-26 DIAGNOSIS — Z124 Encounter for screening for malignant neoplasm of cervix: Secondary | ICD-10-CM | POA: Insufficient documentation

## 2019-10-01 ENCOUNTER — Other Ambulatory Visit: Payer: Self-pay

## 2019-10-01 ENCOUNTER — Encounter (HOSPITAL_COMMUNITY): Payer: Self-pay | Admitting: Emergency Medicine

## 2019-10-01 ENCOUNTER — Emergency Department (HOSPITAL_COMMUNITY)
Admission: EM | Admit: 2019-10-01 | Discharge: 2019-10-01 | Disposition: A | Discharge status: Home / Self Care | Payer: BC Managed Care – PPO | Attending: Emergency Medicine | Admitting: Emergency Medicine

## 2019-10-01 ENCOUNTER — Emergency Department (HOSPITAL_COMMUNITY): Payer: BC Managed Care – PPO

## 2019-10-01 DIAGNOSIS — S20212A Contusion of left front wall of thorax, initial encounter: Secondary | ICD-10-CM | POA: Insufficient documentation

## 2019-10-01 DIAGNOSIS — R0789 Other chest pain: Secondary | ICD-10-CM | POA: Diagnosis present

## 2019-10-01 DIAGNOSIS — Y939 Activity, unspecified: Secondary | ICD-10-CM | POA: Diagnosis not present

## 2019-10-01 DIAGNOSIS — Y929 Unspecified place or not applicable: Secondary | ICD-10-CM | POA: Diagnosis not present

## 2019-10-01 DIAGNOSIS — F17210 Nicotine dependence, cigarettes, uncomplicated: Secondary | ICD-10-CM | POA: Diagnosis not present

## 2019-10-01 DIAGNOSIS — Y999 Unspecified external cause status: Secondary | ICD-10-CM | POA: Diagnosis not present

## 2019-10-01 NOTE — Discharge Instructions (Addendum)
You were evaluated in the Emergency Department and after careful evaluation, we did not find any emergent condition requiring admission or further testing in the hospital.  Your exam/testing today is overall reassuring.  Your symptoms seem to be due to some bruising to the chest wall or ribs.  We recommend Tylenol and/or Motrin at home for discomfort.  Please return to the Emergency Department if you experience any worsening of your condition.  We encourage you to follow up with a primary care provider.  Thank you for allowing [place] to be a part of your care.
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
## 2019-10-02 LAB — CYTOLOGY - PAP
Comment: NEGATIVE
Diagnosis: NEGATIVE
High risk HPV: NEGATIVE

## 2020-01-08 ENCOUNTER — Ambulatory Visit (HOSPITAL_COMMUNITY)
Admission: EM | Admit: 2020-01-08 | Discharge: 2020-01-08 | Disposition: A | Discharge status: Home / Self Care | Payer: HRSA Program | Attending: Internal Medicine | Admitting: Internal Medicine

## 2020-01-08 ENCOUNTER — Encounter (HOSPITAL_COMMUNITY): Payer: Self-pay | Admitting: Urgent Care

## 2020-01-08 ENCOUNTER — Other Ambulatory Visit: Payer: Self-pay

## 2020-01-08 DIAGNOSIS — Z20822 Contact with and (suspected) exposure to covid-19: Secondary | ICD-10-CM | POA: Diagnosis not present

## 2020-01-08 LAB — SARS CORONAVIRUS 2 (TAT 6-24 HRS): SARS Coronavirus 2: NEGATIVE

## 2020-01-08 NOTE — ED Triage Notes (Signed)
Pt presents for COVID testing. Pt denies fever, shortness of breath, cough, or any other symptoms.    

## 2020-12-31 ENCOUNTER — Encounter (HOSPITAL_COMMUNITY): Payer: Self-pay | Admitting: Emergency Medicine

## 2020-12-31 ENCOUNTER — Ambulatory Visit (INDEPENDENT_AMBULATORY_CARE_PROVIDER_SITE_OTHER): Payer: Commercial Managed Care - PPO

## 2020-12-31 ENCOUNTER — Other Ambulatory Visit: Payer: Self-pay

## 2020-12-31 ENCOUNTER — Ambulatory Visit (HOSPITAL_COMMUNITY)
Admission: EM | Admit: 2020-12-31 | Discharge: 2020-12-31 | Disposition: A | Discharge status: Home / Self Care | Payer: Commercial Managed Care - PPO | Attending: Physician Assistant | Admitting: Physician Assistant

## 2020-12-31 DIAGNOSIS — R1011 Right upper quadrant pain: Secondary | ICD-10-CM | POA: Diagnosis not present

## 2020-12-31 DIAGNOSIS — R109 Unspecified abdominal pain: Secondary | ICD-10-CM | POA: Insufficient documentation

## 2020-12-31 LAB — CBC WITH DIFFERENTIAL/PLATELET
Abs Immature Granulocytes: 0.01 10*3/uL (ref 0.00–0.07)
Basophils Absolute: 0 10*3/uL (ref 0.0–0.1)
Basophils Relative: 0 %
Eosinophils Absolute: 0.3 10*3/uL (ref 0.0–0.5)
Eosinophils Relative: 4 %
HCT: 44.1 % (ref 36.0–46.0)
Hemoglobin: 14.2 g/dL (ref 12.0–15.0)
Immature Granulocytes: 0 %
Lymphocytes Relative: 31 %
Lymphs Abs: 2.8 10*3/uL (ref 0.7–4.0)
MCH: 28.3 pg (ref 26.0–34.0)
MCHC: 32.2 g/dL (ref 30.0–36.0)
MCV: 88 fL (ref 80.0–100.0)
Monocytes Absolute: 0.9 10*3/uL (ref 0.1–1.0)
Monocytes Relative: 10 %
Neutro Abs: 4.9 10*3/uL (ref 1.7–7.7)
Neutrophils Relative %: 55 %
Platelets: 304 10*3/uL (ref 150–400)
RBC: 5.01 MIL/uL (ref 3.87–5.11)
RDW: 13.9 % (ref 11.5–15.5)
WBC: 9.1 10*3/uL (ref 4.0–10.5)
nRBC: 0 % (ref 0.0–0.2)

## 2020-12-31 LAB — COMPREHENSIVE METABOLIC PANEL
ALT: 24 U/L (ref 0–44)
AST: 24 U/L (ref 15–41)
Albumin: 4.1 g/dL (ref 3.5–5.0)
Alkaline Phosphatase: 59 U/L (ref 38–126)
Anion gap: 10 (ref 5–15)
BUN: 11 mg/dL (ref 6–20)
CO2: 25 mmol/L (ref 22–32)
Calcium: 9.3 mg/dL (ref 8.9–10.3)
Chloride: 106 mmol/L (ref 98–111)
Creatinine, Ser: 0.84 mg/dL (ref 0.44–1.00)
GFR, Estimated: >60 mL/min (ref >60)
Glucose, Bld: 96 mg/dL (ref 70–99)
Potassium: 4.3 mmol/L (ref 3.5–5.1)
Sodium: 141 mmol/L (ref 135–145)
Total Bilirubin: 0.9 mg/dL (ref 0.3–1.2)
Total Protein: 7.1 g/dL (ref 6.5–8.1)

## 2020-12-31 LAB — POCT URINALYSIS DIPSTICK, ED / UC
Bilirubin Urine: NEGATIVE
Glucose, UA: NEGATIVE mg/dL
Ketones, ur: NEGATIVE mg/dL
Leukocytes,Ua: NEGATIVE
Nitrite: NEGATIVE
Protein, ur: NEGATIVE mg/dL
Specific Gravity, Urine: <=1.005 (ref 1.005–1.030)
Urobilinogen, UA: 0.2 mg/dL (ref 0.0–1.0)
pH: 5.5 (ref 5.0–8.0)

## 2020-12-31 MED ORDER — DICYCLOMINE HCL 10 MG PO CAPS: 10.0000 mg | ORAL_CAPSULE | Freq: Three times a day (TID) | ORAL | 0 refills | Status: DC

## 2020-12-31 NOTE — ED Provider Notes (Signed)
[facility]    CSN: [id number] Arrival date & time: 12/31/20  0802      History   Chief Complaint Chief Complaint  Patient presents with   Abdominal Pain    HPI Audrey Ramirez is a 55 y.o. female.   Patient presents today with a 2-year history of intermittent right-sided abdominal pain.  She denies any current pain but reports during episodes pain is rated 6 on a 0-10 pain scale, localized to right abdomen, described as pressure/cramping as though there is something in her stomach that needs to burst, no aggravating relieving factors identified.  She has not tried any over-the-counter medications for symptom management.  She denies any associated fever, nausea, vomiting, melena, hematochezia.  Reports she is having normal bowel movements with last bowel movement yesterday.  She denies any significant constipation or obstipation.  She denies any history of gastrointestinal disorder.  She does report a decrease in appetite as a result of symptoms as she often feels full quickly.  She reports history of abdominal hysterectomy but denies additional abdominal surgeries.  She has not noticed any association with food consumption.  She has not seen a specialist for this in the past.   Past Medical History:  Diagnosis Date   Asthma     There are no problems to display for this patient.   Past Surgical History:  Procedure Laterality Date   ABDOMINAL HYSTERECTOMY     COLONOSCOPY     FOOT SURGERY Right     OB History   No obstetric history on file.      Home Medications    Prior to Admission medications   Medication Sig Start Date End Date Taking? Authorizing Provider  dicyclomine (BENTYL) 10 MG capsule Take 1 capsule (10 mg total) by mouth 3 (three) times daily before meals. 12/31/20  Yes Lind Ausley K, PA-C  albuterol (PROVENTIL HFA;VENTOLIN HFA) 108 (90 Base) MCG/ACT inhaler Inhale 1-2 puffs into the lungs every 6 (six) hours as needed for wheezing or shortness of  breath. Patient not taking: Reported on 12/31/2020 03/08/18   Leretha Dykes, PA-C    Family History Family History  Problem Relation Age of Onset   Hyperlipidemia Mother     Social History Social History   Tobacco Use   Smoking status: Every Day    Packs/day: 0.50    Years: 25.00    Pack years: 12.50    Types: Cigarettes   Smokeless tobacco: Never  Vaping Use   Vaping Use: Never used  Substance Use Topics   Alcohol use: Yes    Alcohol/week: 2.0 standard drinks    Types: 2 Cans of beer per week   Drug use: No     Allergies   Patient has no known allergies.   Review of Systems Review of Systems  Constitutional:  Negative for activity change, appetite change, fatigue and fever.  Respiratory:  Negative for cough and shortness of breath.   Cardiovascular:  Negative for chest pain.  Gastrointestinal:  Positive for abdominal pain. Negative for constipation, diarrhea, nausea and vomiting.  Musculoskeletal:  Negative for arthralgias and myalgias.  Neurological:  Negative for dizziness, light-headedness and headaches.    Physical Exam Triage Vital Signs ED Triage Vitals  Enc Vitals Group     BP 12/31/20 0829 132/71     Pulse Rate 12/31/20 0829 85     Resp 12/31/20 0829 18     Temp 12/31/20 0829 99 F (37.2 C)  Temp Source 12/31/20 0829 Oral     SpO2 12/31/20 0829 95 %     Weight --      Height --      Head Circumference --      Peak Flow --      Pain Score 12/31/20 0823 0     Pain Loc --      Pain Edu? --      Excl. in GC? --    No data found.  Updated Vital Signs BP 132/71 (BP Location: Right Arm)   Pulse 85   Temp 99 F (37.2 C) (Oral)   Resp 18   SpO2 95%   Visual Acuity Right Eye Distance:   Left Eye Distance:   Bilateral Distance:    Right Eye Near:   Left Eye Near:    Bilateral Near:     Physical Exam Vitals reviewed.  Constitutional:      General: She is awake. She is not in acute distress.    Appearance: Normal appearance. She is  normal weight. She is not ill-appearing.     Comments: Very pleasant female appears stated age no acute distress sitting comfortably in exam room  HENT:     Head: Normocephalic and atraumatic.     Mouth/Throat:     Mouth: Mucous membranes are moist.     Pharynx: Uvula midline. No oropharyngeal exudate or posterior oropharyngeal erythema.  Cardiovascular:     Rate and Rhythm: Normal rate and regular rhythm.     Heart sounds: Normal heart sounds, S1 normal and S2 normal. No murmur heard. Pulmonary:     Effort: Pulmonary effort is normal.     Breath sounds: Normal breath sounds. No wheezing, rhonchi or rales.     Comments: Clear to auscultation bilaterally Abdominal:     General: Bowel sounds are normal.     Palpations: Abdomen is soft.     Tenderness: There is abdominal tenderness in the right upper quadrant. There is no right CVA tenderness, left CVA tenderness, guarding or rebound. Negative signs include Murphy's sign.     Comments: Mild tenderness palpation and right upper quadrant.  No evidence of acute abdomen on physical exam.  Negative Murphy sign.  Musculoskeletal:     Cervical back: Normal range of motion and neck supple.  Psychiatric:        Behavior: Behavior is cooperative.     UC Treatments / Results  Labs (all labs ordered are listed, but only abnormal results are displayed) Labs Reviewed  POCT URINALYSIS DIPSTICK, ED / UC - Abnormal; Notable for the following components:      Result Value   Hgb urine dipstick TRACE (*)    All other components within normal limits  CBC WITH DIFFERENTIAL/PLATELET  COMPREHENSIVE METABOLIC PANEL    EKG   Radiology DG Abdomen 1 View  Result Date: 12/31/2020 CLINICAL DATA:  Right upper quadrant pain. EXAM: ABDOMEN - 1 VIEW COMPARISON:  None. FINDINGS: The bowel gas pattern is normal. No radio-opaque calculi or other significant radiographic abnormality are seen. IMPRESSION: Negative. Electronically Signed   By: Kennith Center M.D.    On: 12/31/2020 09:30    Procedures Procedures (including critical care time)  Medications Ordered in UC Medications - No data to display  Initial Impression / Assessment and Plan / UC Course  I have reviewed the triage vital signs and the nursing notes.  Pertinent labs & imaging results that were available during my care of the patient were reviewed by  me and considered in my medical decision making (see chart for details).      Vital signs and physical exam reassuring today; no indication for emergent evaluation or imaging.  UA showed trace hemoglobin without evidence of infection.  KUB obtained showed no significant abnormalities.  Discussed with patient that we are unable to obtain CT in urgent care and ultimately if symptoms persist she will need more advanced imaging.  She was given contact information for gastroenterologist and instructed follow-up with them if symptoms persist.  Basic labs obtained today-results pending.  We will start patient on dicyclomine with hopes this will provide some relief of symptoms.  Discussed alarm symptoms that warrant emergent evaluation.  Strict return precautions given to which patient expressed understanding.  Final Clinical Impressions(s) / UC Diagnoses   Final diagnoses:  Right sided abdominal pain     Discharge Instructions      Your x-ray was normal.  I think ultimately we are going to need more advanced imaging to determine what is causing your symptoms.  Please follow-up with gastroenterology as we discussed.  We will contact you if your lab work is abnormal.  Make sure you are drinking plenty of fluid and eat a bland diet.  I have called in a medication known as dicyclomine to help with stomach cramping symptoms.  Take this up to 3 times a day before meals.  If you have any severe symptoms including significant abdominal pain, nausea/vomiting, blood in her stool, fever you need to go to the emergency room for CT scan as we  discussed.     ED Prescriptions     Medication Sig Dispense Auth. Provider   dicyclomine (BENTYL) 10 MG capsule Take 1 capsule (10 mg total) by mouth 3 (three) times daily before meals. 30 capsule Bhavika Schnider K, PA-C      PDMP not reviewed this encounter.   Jeani Hawking, PA-C 12/31/20 [id number]

## 2020-12-31 NOTE — ED Triage Notes (Addendum)
Sharp pain in right side of abdomen-intermittent for 2 years.  Pain radiates to navel.  Pain is getting worse overtime, particularly with bending forward.  Last bm was last night, normal per patient.  Feels like something is going to "bust" in abdomen.  Reports with walking, "feels like a balloon swelling"

## 2020-12-31 NOTE — Discharge Instructions (Addendum)
Your x-ray was normal.  I think ultimately we are going to need more advanced imaging to determine what is causing your symptoms.  Please follow-up with gastroenterology as we discussed.  We will contact you if your lab work is abnormal.  Make sure you are drinking plenty of fluid and eat a bland diet.  I have called in a medication known as dicyclomine to help with stomach cramping symptoms.  Take this up to 3 times a day before meals.  If you have any severe symptoms including significant abdominal pain, nausea/vomiting, blood in her stool, fever you need to go to the emergency room for CT scan as we discussed.

## 2021-03-01 ENCOUNTER — Ambulatory Visit (HOSPITAL_BASED_OUTPATIENT_CLINIC_OR_DEPARTMENT_OTHER): Payer: Commercial Managed Care - PPO | Admitting: Family Medicine

## 2021-03-02 ENCOUNTER — Encounter (HOSPITAL_BASED_OUTPATIENT_CLINIC_OR_DEPARTMENT_OTHER): Payer: Self-pay | Admitting: Family Medicine

## 2021-03-02 ENCOUNTER — Other Ambulatory Visit: Payer: Self-pay

## 2021-03-02 ENCOUNTER — Ambulatory Visit (INDEPENDENT_AMBULATORY_CARE_PROVIDER_SITE_OTHER): Payer: Commercial Managed Care - PPO | Admitting: Family Medicine

## 2021-03-02 VITALS — BP 136/74 | HR 84 | Ht 63.25 in | Wt 165.6 lb

## 2021-03-02 DIAGNOSIS — Z8616 Personal history of COVID-19: Secondary | ICD-10-CM | POA: Diagnosis not present

## 2021-03-02 DIAGNOSIS — R140 Abdominal distension (gaseous): Secondary | ICD-10-CM | POA: Insufficient documentation

## 2021-03-02 DIAGNOSIS — Z0000 Encounter for general adult medical examination without abnormal findings: Secondary | ICD-10-CM

## 2021-03-02 DIAGNOSIS — Z7689 Persons encountering health services in other specified circumstances: Secondary | ICD-10-CM

## 2021-03-02 DIAGNOSIS — R1084 Generalized abdominal pain: Secondary | ICD-10-CM

## 2021-03-02 DIAGNOSIS — R109 Unspecified abdominal pain: Secondary | ICD-10-CM | POA: Insufficient documentation

## 2021-03-02 NOTE — Progress Notes (Signed)
New Patient Office Visit  Subjective:  Patient ID: Audrey Ramirez, female    DOB: [birth year]  Age: 55 y.o. MRN: [id number]  CC:  Chief Complaint  Patient presents with   Establish Care    Prior PCP is Dr. Clide Deutscher in [place]   Irritable Bowel Syndrome    Patient is having issues with bloating and gas. She does not have a GI doctor at the moment. She states she has been following  an IBS geared diet but even in limiting food groups she is still having gastric issues.   Covid Positive    Patient is 2 weeks post recovering from Covid 19 and states "I just don't feel good". She states she is not eating or having the appetite that she once had. She is barely eating one meal a day where as previously she would eat 2-3 times daily.     HPI Audrey Ramirez is a 55 year old female presenting to establish in clinic.  She reports current concerns as outlined above.  Reports history of abdominal issues.  History of recent coronavirus infection: Reports that she was diagnosed with COVID-19 infection about 2 weeks ago.  Since then she feels that she has had continued issues with some fatigue, not feeling back to 100%, decreased appetite.  These acute changes are in the setting of having history of GI issues, reported history of irritable bowel syndrome.  Patient seems unsure of whether she has seen a GI specialist in the past, however she does report having a colonoscopy last year.  Indicates that she has had normal bowel movements but has had associated bloating, abdominal discomfort.  Past Medical History:  Diagnosis Date   Asthma     Past Surgical History:  Procedure Laterality Date   ABDOMINAL HYSTERECTOMY     COLONOSCOPY     FOOT SURGERY Right     Family History  Problem Relation Age of Onset   Hyperlipidemia Mother    Alzheimer's disease Father    Stroke Maternal Aunt     Social History   Socioeconomic History   Marital status: Single    Spouse name: Not on file    Number of children: Not on file   Years of education: Not on file   Highest education level: Not on file  Occupational History   Not on file  Tobacco Use   Smoking status: Every Day    Packs/day: 0.50    Years: 25.00    Pack years: 12.50    Types: Cigarettes    Passive exposure: Never   Smokeless tobacco: Never  Vaping Use   Vaping Use: Never used  Substance and Sexual Activity   Alcohol use: Yes    Alcohol/week: 2.0 standard drinks    Types: 2 Cans of beer per week   Drug use: No   Sexual activity: Yes    Birth control/protection: Surgical, None  Other Topics Concern   Not on file  Social History Narrative   Not on file   Social Determinants of Health   Financial Resource Strain: Not on file  Food Insecurity: Not on file  Transportation Needs: Not on file  Physical Activity: Not on file  Stress: Not on file  Social Connections: Not on file  Intimate Partner Violence: Not on file    Objective:   Today's Vitals: BP 136/74   Pulse 84   Ht 5' 3.25" (1.607 m)   Wt 165 lb 9.6 oz (75.1 kg)   SpO2 98%  BMI 29.10 kg/m   Physical Exam  55 year old female in no acute distress Cardiovascular exam regular rate and rhythm, no murmurs appreciated Lungs clear to auscultation bilaterally  Assessment & Plan:   Problem List Items Addressed This Visit       Other   Abdominal pain - Primary    Has had chronic issues with abdominal pain and associated bloating Provided handout today regarding low FODMAP diet and recommended foods to try to avoid in her diet Will refer to GI for further evaluation and management given reported history of IBS      Relevant Orders   Ambulatory referral to Gastroenterology   Bloating    Possibly related to underlying IBS Provided handout and discussed trial of low FODMAP diet Will refer to GI for further evaluation management      Relevant Orders   Ambulatory referral to Gastroenterology   Ambulatory referral to Obstetrics /  Gynecology   History of 2019 novel coronavirus disease (COVID-19)    Reports being diagnosed about 2 weeks ago Continues to have some symptoms and is not back to baseline, primary symptoms are fatigue, decreased appetite Patient in no acute distress on exam, normal cardiopulmonary exam Discussed variable recovery course related to coronavirus illness, provided general recommendations, recommend continuing with conservative measures      Other Visit Diagnoses     Wellness examination       Relevant Orders   CBC with Differential/Platelet   Comprehensive metabolic panel   Hemoglobin A1c   Lipid panel   TSH Rfx on Abnormal to Free T4   Encounter to establish care       Relevant Orders   Ambulatory referral to Obstetrics / Gynecology       Outpatient Encounter Medications as of 03/02/2021  Medication Sig   dicyclomine (BENTYL) 10 MG capsule Take 1 capsule (10 mg total) by mouth 3 (three) times daily before meals.   [DISCONTINUED] albuterol (PROVENTIL HFA;VENTOLIN HFA) 108 (90 Base) MCG/ACT inhaler Inhale 1-2 puffs into the lungs every 6 (six) hours as needed for wheezing or shortness of breath. (Patient not taking: No sig reported)   No facility-administered encounter medications on file as of 03/02/2021.    Follow-up: Return in about 4 months (around 06/30/2021).  Plan for follow-up in about 3 to 4 months for CPE with nurse visit 1 week prior for labs.  Oriel Rumbold J De [place], MD

## 2021-03-02 NOTE — Assessment & Plan Note (Signed)
Reports being diagnosed about 2 weeks ago Continues to have some symptoms and is not back to baseline, primary symptoms are fatigue, decreased appetite Patient in no acute distress on exam, normal cardiopulmonary exam Discussed variable recovery course related to coronavirus illness, provided general recommendations, recommend continuing with conservative measures

## 2021-03-02 NOTE — Assessment & Plan Note (Signed)
Has had chronic issues with abdominal pain and associated bloating Provided handout today regarding low FODMAP diet and recommended foods to try to avoid in her diet Will refer to GI for further evaluation and management given reported history of IBS

## 2021-03-02 NOTE — Assessment & Plan Note (Signed)
Possibly related to underlying IBS Provided handout and discussed trial of low FODMAP diet Will refer to GI for further evaluation management

## 2021-03-02 NOTE — Patient Instructions (Signed)
  Medication Instructions:  Your physician recommends that you continue on your current medications as directed. Please refer to the Current Medication list given to you today. --If you need a refill on any your medications before your next appointment, please call your pharmacy first. If no refills are authorized on file call the office.-- Lab Work: Your physician has recommended that you have lab work 1 WEEK PRIOR TO CPE If you have labs (blood work) drawn today and your tests are completely normal, you will receive your results via MyChart message OR a phone call from our staff.  Please ensure you check your voicemail in the event that you authorized detailed messages to be left on a delegated number. If you have any lab test that is abnormal or we need to change your treatment, we will call you to review the results.  Referrals/Procedures/Imaging: A referral has been placed for you to [facility] for evaluation and treatment. Someone from the scheduling department will be in contact with you in regards to coordinating your procedure. If you do not hear from any of the schedulers within 7-10 business days please give their office a call at [phone]. Their endoscopy office hours are Monday - Friday 7:30 am - 6:00 pm  [facility] [address] [place], [place] [address] Phone: [phone]  A referral has been placed for you to an OB/GYN for evaluation and treatment. Someone from the scheduling department will be in contact with you in regards to coordinating your consultation. If you do not hear from any of the schedulers within 7-10 business days please give their office a call.  Follow-Up: Your next appointment:   Your physician recommends that you schedule a follow-up appointment in: 3-4 MONTHS with Dr. de Peru  You will receive a text message or e-mail with a link to a survey about your care and experience with [place] today! We would greatly appreciate  your feedback!   Thanks for letting us be apart of your health journey!!  Primary Care and Sports Medicine   Dr. Ceasar Mons Peru   We encourage you to activate your patient portal called "MyChart".  Sign up information is provided on this After Visit Summary.  MyChart is used to connect with patients for Virtual Visits (Telemedicine).  Patients are able to view lab/test results, encounter notes, upcoming appointments, etc.  Non-urgent messages can be sent to your provider as well. To learn more about what you can do with MyChart, please visit --  [email or web address].

## 2021-06-23 ENCOUNTER — Ambulatory Visit (HOSPITAL_BASED_OUTPATIENT_CLINIC_OR_DEPARTMENT_OTHER): Payer: Self-pay

## 2021-06-30 ENCOUNTER — Other Ambulatory Visit: Payer: Self-pay

## 2021-06-30 ENCOUNTER — Ambulatory Visit (INDEPENDENT_AMBULATORY_CARE_PROVIDER_SITE_OTHER): Payer: Commercial Managed Care - PPO | Admitting: Family Medicine

## 2021-06-30 ENCOUNTER — Encounter (HOSPITAL_BASED_OUTPATIENT_CLINIC_OR_DEPARTMENT_OTHER): Payer: Self-pay | Admitting: Family Medicine

## 2021-06-30 VITALS — BP 138/102 | HR 76 | Ht 63.25 in | Wt 164.0 lb

## 2021-06-30 DIAGNOSIS — Z23 Encounter for immunization: Secondary | ICD-10-CM

## 2021-06-30 DIAGNOSIS — R140 Abdominal distension (gaseous): Secondary | ICD-10-CM

## 2021-06-30 DIAGNOSIS — Z1231 Encounter for screening mammogram for malignant neoplasm of breast: Secondary | ICD-10-CM | POA: Diagnosis not present

## 2021-06-30 DIAGNOSIS — R1084 Generalized abdominal pain: Secondary | ICD-10-CM

## 2021-06-30 DIAGNOSIS — Z0000 Encounter for general adult medical examination without abnormal findings: Secondary | ICD-10-CM

## 2021-06-30 MED ORDER — SHINGRIX 50 MCG/0.5ML IM SUSR: 0.5000 mL | Freq: Once | INTRAMUSCULAR | 0 refills | Status: AC

## 2021-06-30 NOTE — Patient Instructions (Signed)
?  Medication Instructions:  ?Your physician recommends that you continue on your current medications as directed. Please refer to the Current Medication list given to you today. ?--If you need a refill on any your medications before your next appointment, please call your pharmacy first. If no refills are authorized on file call the office.-- ?Referrals/Procedures/Imaging: ?A referral has been placed for you to [facility] for evaluation and treatment. Someone from the scheduling department will be in contact with you in regards to coordinating your consultation. If you do not hear from any of the schedulers within 7-10 business days please give their office a call. ? ?Follow-Up: ?Your next appointment:   ?Your physician recommends that you schedule a follow-up appointment in: 2-3 MONTHS with Dr. de [place] ? ?You will receive a text message or e-mail with a link to a survey about your care and experience with [place] today! We would greatly appreciate your feedback!  ? ?Thanks for letting us be apart of your health journey!!  ?Primary Care and Sports Medicine  ? ?Dr. Marcy Salvo de [place]  ? ?We encourage you to activate your patient portal called "MyChart".  Sign up information is provided on this After Visit Summary.  MyChart is used to connect with patients for Virtual Visits (Telemedicine).  Patients are able to view lab/test results, encounter notes, upcoming appointments, etc.  Non-urgent messages can be sent to your provider as well. To learn more about what you can do with MyChart, please visit --  [email or web address].   ? ?

## 2021-06-30 NOTE — Progress Notes (Signed)
?Subjective:   ? ?CC: Annual Physical Exam ? ?HPI:  ?Audrey Ramirez is a 56 y.o. presenting for annual physical ? ?I reviewed the past medical history, family history, social history, surgical history, and allergies today and no changes were needed.  Please see the problem list section below in epic for further details. ? ?Past Medical History: ?Past Medical History:  ?Diagnosis Date  ? Asthma   ? ?Past Surgical History: ?Past Surgical History:  ?Procedure Laterality Date  ? ABDOMINAL HYSTERECTOMY    ? COLONOSCOPY    ? FOOT SURGERY Right   ? ?Social History: ?Social History  ? ?Socioeconomic History  ? Marital status: Single  ?  Spouse name: Not on file  ? Number of children: Not on file  ? Years of education: Not on file  ? Highest education level: Not on file  ?Occupational History  ? Not on file  ?Tobacco Use  ? Smoking status: Every Day  ?  Packs/day: 0.50  ?  Years: 25.00  ?  Pack years: 12.50  ?  Types: Cigarettes  ?  Passive exposure: Never  ? Smokeless tobacco: Never  ?Vaping Use  ? Vaping Use: Never used  ?Substance and Sexual Activity  ? Alcohol use: Yes  ?  Alcohol/week: 2.0 standard drinks  ?  Types: 2 Cans of beer per week  ? Drug use: No  ? Sexual activity: Yes  ?  Birth control/protection: Surgical, None  ?Other Topics Concern  ? Not on file  ?Social History Narrative  ? Not on file  ? ?Social Determinants of Health  ? ?Financial Resource Strain: Not on file  ?Food Insecurity: Not on file  ?Transportation Needs: Not on file  ?Physical Activity: Not on file  ?Stress: Not on file  ?Social Connections: Not on file  ? ?Family History: ?Family History  ?Problem Relation Age of Onset  ? Hyperlipidemia Mother   ? Alzheimer's disease Father   ? Stroke Maternal Aunt   ? ?Allergies: ?No Known Allergies ?Medications: See med rec. ? ?Review of Systems: No headache, visual changes, nausea, vomiting, diarrhea, constipation, dizziness, abdominal pain, skin rash, fevers, chills, night sweats, swollen lymph nodes,  weight loss, chest pain, body aches, joint swelling, muscle aches, shortness of breath, mood changes, visual or auditory hallucinations. ? ?Objective:   ? ?BP (!) 138/102   Pulse 76   Ht 5' 3.25" (1.607 m)   Wt 164 lb (74.4 kg)   SpO2 98%   BMI 28.82 kg/m?  ? ?General: Well Developed, well nourished, and in no acute distress.  ?Neuro: Alert and oriented x3, extra-ocular muscles intact, sensation grossly intact. Cranial nerves II through XII are intact, motor, sensory, and coordinative functions are all intact. ?HEENT: Normocephalic, atraumatic, pupils equal round reactive to light, neck supple, no masses, no lymphadenopathy, thyroid nonpalpable. Oropharynx, nasopharynx, external ear canals are unremarkable. ?Skin: Warm and dry, no rashes noted.  ?Cardiac: Regular rate and rhythm, no murmurs rubs or gallops.  ?Respiratory: Clear to auscultation bilaterally. Not using accessory muscles, speaking in full sentences.  ?Abdominal: Soft, nontender, nondistended, positive bowel sounds, no masses, no organomegaly.  ?Musculoskeletal: Shoulder, elbow, wrist, hip, knee, ankle stable, and with full range of motion. ? ?Impression and Recommendations:   ? ?Wellness examination ?Routine HCM labs ordered. HCM reviewed/discussed. Anticipatory guidance regarding healthy weight, lifestyle and choices given. ?Recommend healthy diet.  Recommend approximately 150 minutes/week of moderate intensity exercise ?Recommend regular dental and vision exams ?Always use seatbelt/lap and shoulder restraints ?Recommend using smoke  alarms and checking batteries at least twice a year ?Recommend using sunscreen when outside ?Recommend tetanus booster, administered today ?Discussed smoking cessation options - patient elected to proceed with nicotine replacement with gums. Will monitor response with this in the future, consider patches, Chantix if not successful with gum ?Order for mammogram placed ? ?New referral to GI placed for continued symptoms.  Patient did not arrange establishing visit with prior referral which has been closed ? ?Plan for follow-up in about 2-3 months or sooner as needed ? ? ?___________________________________________ ?Fender Herder de [place], MD, ABFM, CAQSM ?Primary Care and Sports Medicine ?[facility] ?

## 2021-06-30 NOTE — Assessment & Plan Note (Signed)
Routine HCM labs ordered. HCM reviewed/discussed. Anticipatory guidance regarding healthy weight, lifestyle and choices given. ?Recommend healthy diet.  Recommend approximately 150 minutes/week of moderate intensity exercise ?Recommend regular dental and vision exams ?Always use seatbelt/lap and shoulder restraints ?Recommend using smoke alarms and checking batteries at least twice a year ?Recommend using sunscreen when outside ?Recommend tetanus booster, administered today ?Discussed smoking cessation options - patient elected to proceed with nicotine replacement with gums. Will monitor response with this in the future, consider patches, Chantix if not successful with gum ?Order for mammogram placed ?

## 2021-07-01 LAB — CBC WITH DIFFERENTIAL/PLATELET
Basophils Absolute: 0 10*3/uL (ref 0.0–0.2)
Basos: 0 %
EOS (ABSOLUTE): 0.3 10*3/uL (ref 0.0–0.4)
Eos: 4 %
Hematocrit: 43.7 % (ref 34.0–46.6)
Hemoglobin: 14.3 g/dL (ref 11.1–15.9)
Immature Grans (Abs): 0 10*3/uL (ref 0.0–0.1)
Immature Granulocytes: 0 %
Lymphocytes Absolute: 3.1 10*3/uL (ref 0.7–3.1)
Lymphs: 38 %
MCH: 28 pg (ref 26.6–33.0)
MCHC: 32.7 g/dL (ref 31.5–35.7)
MCV: 86 fL (ref 79–97)
Monocytes Absolute: 0.7 10*3/uL (ref 0.1–0.9)
Monocytes: 8 %
Neutrophils Absolute: 4 10*3/uL (ref 1.4–7.0)
Neutrophils: 50 %
Platelets: 281 10*3/uL (ref 150–450)
RBC: 5.1 x10E6/uL (ref 3.77–5.28)
RDW: 12.9 % (ref 11.7–15.4)
WBC: 8.2 10*3/uL (ref 3.4–10.8)

## 2021-07-01 LAB — COMPREHENSIVE METABOLIC PANEL
ALT: 15 IU/L (ref 0–32)
AST: 19 IU/L (ref 0–40)
Albumin/Globulin Ratio: 2.3 — ABNORMAL HIGH (ref 1.2–2.2)
Albumin: 4.9 g/dL (ref 3.8–4.9)
Alkaline Phosphatase: 73 IU/L (ref 44–121)
BUN/Creatinine Ratio: 14 (ref 9–23)
BUN: 12 mg/dL (ref 6–24)
Bilirubin Total: 0.3 mg/dL (ref 0.0–1.2)
CO2: 21 mmol/L (ref 20–29)
Calcium: 9.6 mg/dL (ref 8.7–10.2)
Chloride: 106 mmol/L (ref 96–106)
Creatinine, Ser: 0.87 mg/dL (ref 0.57–1.00)
Globulin, Total: 2.1 g/dL (ref 1.5–4.5)
Glucose: 93 mg/dL (ref 70–99)
Potassium: 4.9 mmol/L (ref 3.5–5.2)
Sodium: 142 mmol/L (ref 134–144)
Total Protein: 7 g/dL (ref 6.0–8.5)
eGFR: 78 mL/min/{1.73_m2} (ref >59)

## 2021-07-01 LAB — LIPID PANEL
Chol/HDL Ratio: 3.3 ratio (ref 0.0–4.4)
Cholesterol, Total: 212 mg/dL — ABNORMAL HIGH (ref 100–199)
HDL: 65 mg/dL (ref >39)
LDL Chol Calc (NIH): 133 mg/dL — ABNORMAL HIGH (ref 0–99)
Triglycerides: 81 mg/dL (ref 0–149)
VLDL Cholesterol Cal: 14 mg/dL (ref 5–40)

## 2021-07-01 LAB — TSH RFX ON ABNORMAL TO FREE T4: TSH: 0.778 u[IU]/mL (ref 0.450–4.500)

## 2021-07-01 LAB — HEMOGLOBIN A1C
Est. average glucose Bld gHb Est-mCnc: 120 mg/dL
Hgb A1c MFr Bld: 5.8 % — ABNORMAL HIGH (ref 4.8–5.6)

## 2021-08-11 ENCOUNTER — Encounter: Payer: Self-pay | Admitting: Nurse Practitioner

## 2021-08-26 ENCOUNTER — Ambulatory Visit (INDEPENDENT_AMBULATORY_CARE_PROVIDER_SITE_OTHER): Payer: Commercial Managed Care - PPO | Admitting: Nurse Practitioner

## 2021-08-26 ENCOUNTER — Encounter: Payer: Self-pay | Admitting: Nurse Practitioner

## 2021-08-26 VITALS — BP 122/82 | HR 94 | Ht 63.25 in | Wt 162.2 lb

## 2021-08-26 DIAGNOSIS — Z86010 Personal history of colonic polyps: Secondary | ICD-10-CM

## 2021-08-26 DIAGNOSIS — R1310 Dysphagia, unspecified: Secondary | ICD-10-CM | POA: Diagnosis not present

## 2021-08-26 DIAGNOSIS — R140 Abdominal distension (gaseous): Secondary | ICD-10-CM

## 2021-08-26 MED ORDER — OMEPRAZOLE 20 MG PO CPDR: 20.0000 mg | DELAYED_RELEASE_CAPSULE | Freq: Every day | ORAL | 3 refills | Status: AC

## 2021-08-26 NOTE — Patient Instructions (Signed)
?  RECOMMENDATIONS: ? ?Once you have your insurance situated, please call us at [phone] to set up your procedure. ?Miralax- Dissolve one capful in 8 ounces of water and drink before bed. ?Take over the counter Dulcolax 1 tablet every third night as needed to increase stool output. ?Please try and obtain a copy of the last colonoscopy and biopsy report. ? ?Thank you for trusting me with your gastrointestinal care!   ? ?Arnaldo Natal, CRNP ? ? ? ?BMI: ? ?If you are age 70 or older, your body mass index should be between 23-30. Your Body mass index is 28.51 kg/m?Marland Kitchen If this is out of the aforementioned range listed, please consider follow up with your Primary Care Provider. ? ?If you are age 6 or younger, your body mass index should be between 19-25. Your Body mass index is 28.51 kg/m?Marland Kitchen If this is out of the aformentioned range listed, please consider follow up with your Primary Care Provider.  ? ?MY CHART: ? ?The [facility] providers would like to encourage you to use [facility] to communicate with providers for non-urgent requests or questions.  Due to long hold times on the telephone, sending your provider a message by [facility] may be a faster and more efficient way to get a response.  Please allow 48 business hours for a response.  Please remember that this is for non-urgent requests.  ? ?

## 2021-08-26 NOTE — Progress Notes (Signed)
? ? ? ?08/26/2021 ?[place] ?[id number] ?[birth year] ? ? ?CHIEF COMPLAINT: Difficulty swallowing, abdominal bloat and gas  ? ?HISTORY OF PRESENT ILLNESS: Audrey Ramirez is a 56 year old female with a past medical history of anxiety, asthma and irritable bowel syndrome.  Past hysterectomy.  She presents to our office today as referred by Dr. de [place] for further evaluation regarding dysphagia and abdominal bloat.  She describes having intermittent dysphagia, food briefly gets stuck in her throat or to the lower esophagus which occurs approximately twice weekly for the past 5 to 6 years.  She drinks water or waits a few minutes and the stuck food passes down the esophagus into the stomach.  She also endorses having heartburn daily for the past 5 to 6 years.  She denies ever having an upper endoscopy.  She complains of having chronic bad breath.  She previously thought her bad breath was due to dental issues but when she had her teeth pulled she continued to have bad breath.  She reports passing normal formed brown bowel movement daily but does not feel emptied.  She passes a large amount of flatulence which is bothersome to her.  No upper or lower abdominal pain.  She reported undergoing a colonoscopy about 1-1/2 years ago and a few polyps were removed and a repeat colonoscopy was recommended in 1 or 2 years.  She cannot recall which gastroenterologist completed her colonoscopy but had it done in [place].  Unable to locate any colonoscopy record in epic or care everywhere.  No known family history of esophageal, gastric or colorectal cancer.  ? ?Past Medical History:  ?Diagnosis Date  ? Anxiety   ? Asthma   ? History of IBS   ? ?Past Surgical History:  ?Procedure Laterality Date  ? ABDOMINAL HYSTERECTOMY    ? COLONOSCOPY    ? FOOT SURGERY Right   ? ?Social History: She is single.  She is employed as a Secretary/administrator.  She smokes cigarettes 1/2 PPD x 30 years. She drinks one beer daily. No drug use.  ? ?Family  History: Sister with history of colon polyps.  Mother with diabetes and hyperlipidemia father's history unknown. Maternal aunt had a stroke.  ? ?No Known Allergies ? ?  ?Outpatient Encounter Medications as of 08/26/2021  ?Medication Sig  ? diphenhydrAMINE (BENADRYL) 25 MG tablet Take 25 mg by mouth every 6 (six) hours as needed.  ? [DISCONTINUED] dicyclomine (BENTYL) 10 MG capsule Take 1 capsule (10 mg total) by mouth 3 (three) times daily before meals. (Patient not taking: Reported on 08/26/2021)  ? ?No facility-administered encounter medications on file as of 08/26/2021.  ? ? ?REVIEW OF SYSTEMS:  ?Gen: + Fatigue. Denies fever, sweats or chills. No weight loss.  ?CV: Denies chest pain, palpitations or edema. ?Resp: Denies cough, shortness of breath of hemoptysis.  ?GI: Denies heartburn, dysphagia, stomach or lower abdominal pain. No diarrhea or constipation.  ?GU : + Excessive urination.  No hematuria.   ?MS: Denies joint pain, muscles aches or weakness. ?Derm: Denies rash, itchiness, skin lesions or unhealing ulcers. ?Psych: + Back pain, muscle cramps. ?Heme: Denies bruising, easy bleeding. ?Neuro:  Denies headaches, dizziness or paresthesias. ?Endo:  Denies any problems with DM, thyroid or adrenal function. ? ?PHYSICAL EXAM: ?BP 122/82   Pulse 94   Ht 5' 3.25" (1.607 m)   Wt 162 lb 4 oz (73.6 kg)   SpO2 96%   BMI 28.51 kg/m?  ? ?Wt Readings from Last 3 Encounters:  ?08/26/21  162 lb 4 oz (73.6 kg)  ?06/30/21 164 lb (74.4 kg)  ?03/02/21 165 lb 9.6 oz (75.1 kg)  ?  ?General: 56 year old female in no acute distress. ?Head: Normocephalic and atraumatic. ?Eyes:  Sclerae non-icteric, conjunctive pink. ?Ears: Normal auditory acuity. ?Mouth: Lower dentures in use.  No ulcers or lesions.  ?Neck: Supple, no lymphadenopathy or thyromegaly.  ?Lungs: Clear bilaterally to auscultation without wheezes, crackles or rhonchi. ?Heart: Regular rate and rhythm. No murmur, rub or gallop appreciated.  ?Abdomen: Soft, nontender, non  distended. No masses. No hepatosplenomegaly. Normoactive bowel sounds x 4 quadrants.  ?Rectal: Deferred. ?Musculoskeletal: Symmetrical with no gross deformities. ?Skin: Warm and dry. No rash or lesions on visible extremities. ?Extremities: No edema. ?Neurological: Alert oriented x 4, no focal deficits.  ?Psychological:  Alert and cooperative. Normal mood and affect. ? ?ASSESSMENT AND PLAN: ? ?59) 56 year old female with GERD symptoms and dysphagia for 5 to 6 years and intermittent halitosis. ?-EGD benefits and risks discussed including risk with sedation, risk of bleeding, perforation and infection.  She will contact her office when she is ready to schedule an EGD as she needs to verify her insurance coverage due to concerns for possible lapse in insurance coverage in the near future ?-Prilosec 20 mg 1 p.o. daily OTC ? ?2) Incomplete stool evacuation with abdominal bloat ?-MiraLAX nightly as tolerated to increase stool output ? ?3) Reported history of colon polyps per colonoscopy completed approximately 1-1/2 years ago. ?-Patient to obtain a copy of her colonoscopy procedure and biopsy results and bring to our office for my review ?-Recall colonoscopy date to be determined after past colonoscopy records reviewed ? ? ? ? ? ? ? ?CC:  de [place], Blondell Reveal, MD ? ? ? ?

## 2021-08-26 NOTE — Progress Notes (Signed)
Agree with assessment and plan as outlined.  

## 2021-09-23 ENCOUNTER — Ambulatory Visit (HOSPITAL_BASED_OUTPATIENT_CLINIC_OR_DEPARTMENT_OTHER): Payer: Commercial Managed Care - PPO | Admitting: Family Medicine

## 2021-09-23 ENCOUNTER — Encounter (HOSPITAL_BASED_OUTPATIENT_CLINIC_OR_DEPARTMENT_OTHER): Payer: Self-pay

## 2021-10-07 ENCOUNTER — Ambulatory Visit (HOSPITAL_COMMUNITY)
Admission: EM | Admit: 2021-10-07 | Discharge: 2021-10-07 | Disposition: A | Discharge status: Home / Self Care | Payer: Self-pay | Attending: Student | Admitting: Student

## 2021-10-07 ENCOUNTER — Encounter (HOSPITAL_COMMUNITY): Payer: Self-pay

## 2021-10-07 DIAGNOSIS — R051 Acute cough: Secondary | ICD-10-CM

## 2021-10-07 DIAGNOSIS — F17210 Nicotine dependence, cigarettes, uncomplicated: Secondary | ICD-10-CM

## 2021-10-07 MED ORDER — AMOXICILLIN-POT CLAVULANATE 875-125 MG PO TABS
1.0000 | ORAL_TABLET | Freq: Two times a day (BID) | ORAL | 0 refills | Status: DC
Start: 2021-10-07 — End: 2021-10-31

## 2021-10-07 MED ORDER — ALBUTEROL SULFATE HFA 108 (90 BASE) MCG/ACT IN AERS
1.0000 | INHALATION_SPRAY | Freq: Four times a day (QID) | RESPIRATORY_TRACT | 0 refills | Status: AC | PRN
Start: 2021-10-07 — End: ?

## 2021-10-07 NOTE — ED Triage Notes (Signed)
Pt c/o cough and congestion x5 days that is now in her chest and can't get it out. Pt c/o SOB x3 days. Took mucinex with little relief.

## 2021-10-07 NOTE — Discharge Instructions (Addendum)
-  Start the antibiotic-Augmentin (amoxicillin-clavulanate), 1 pill every 12 hours for 7 days.  You can take this with food like with breakfast and dinner. -Albuterol inhaler as needed for cough, wheezing, shortness of breath, 1 to 2 puffs every 6 hours as needed. -Continue Mucinex -With a virus, you're typically contagious for 5-7 days, or as long as you're having fevers.  -Follow-up if symptoms worsen, follow-up. This includes worsening shortness of breath, new chest pain, new dizziness, new weakness.

## 2021-10-07 NOTE — ED Provider Notes (Signed)
[place]    CSN: [id number] Arrival date & time: 10/07/21  0802      History   Chief Complaint Chief Complaint  Patient presents with   Cough   Shortness of Breath    HPI Audrey Ramirez is a 56 y.o. female presenting with cough and congestion for 5 days.  She is a current smoker, history of asthma but no current inhalers.  She describes cough increasingly productive of purulent sputum, this is primarily yellow.  There is some dyspnea during coughing episodes, but denies current shortness of breath, chest pain, fevers.  She states that Mucinex helps somewhat, but she is concerned that the symptoms have not resolved.  HPI  Past Medical History:  Diagnosis Date   Anxiety    Asthma    History of IBS     Patient Active Problem List   Diagnosis Date Noted   Dysphagia 08/26/2021   Wellness examination 06/30/2021   Abdominal pain 03/02/2021   Bloating 03/02/2021   History of 2019 novel coronavirus disease (COVID-19) 03/02/2021    Past Surgical History:  Procedure Laterality Date   ABDOMINAL HYSTERECTOMY     COLONOSCOPY     FOOT SURGERY Right     OB History   No obstetric history on file.      Home Medications    Prior to Admission medications   Medication Sig Start Date End Date Taking? Authorizing Provider  albuterol (VENTOLIN HFA) 108 (90 Base) MCG/ACT inhaler Inhale 1-2 puffs into the lungs every 6 (six) hours as needed for wheezing or shortness of breath. 10/07/21  Yes Hazel Sams, PA-C  amoxicillin-clavulanate (AUGMENTIN) 875-125 MG tablet Take 1 tablet by mouth every 12 (twelve) hours. 10/07/21  Yes Hazel Sams, PA-C  diphenhydrAMINE (BENADRYL) 25 MG tablet Take 25 mg by mouth every 6 (six) hours as needed.    [provider]  omeprazole (PRILOSEC) 20 MG capsule Take 1 capsule (20 mg total) by mouth daily. 08/26/21   Noralyn Pick, NP    Family History Family History  Problem Relation Age of Onset   Hyperlipidemia  Mother    Diabetes Mother    Alzheimer's disease Father    Colon polyps Sister    Stroke Maternal Aunt    Colon cancer Neg Hx    Esophageal cancer Neg Hx    Pancreatic cancer Neg Hx    Stomach cancer Neg Hx     Social History Social History   Tobacco Use   Smoking status: Every Day    Packs/day: 0.50    Years: 25.00    Total pack years: 12.50    Types: Cigarettes    Passive exposure: Never   Smokeless tobacco: Never  Vaping Use   Vaping Use: Former  Substance Use Topics   Alcohol use: Yes    Alcohol/week: 7.0 standard drinks of alcohol    Types: 7 Cans of beer per week   Drug use: No     Allergies   Patient has no known allergies.   Review of Systems Review of Systems  Constitutional:  Negative for appetite change, chills and fever.  HENT:  Positive for congestion. Negative for ear pain, rhinorrhea, sinus pressure, sinus pain and sore throat.   Eyes:  Negative for redness and visual disturbance.  Respiratory:  Positive for cough. Negative for chest tightness, shortness of breath and wheezing.   Cardiovascular:  Negative for chest pain and palpitations.  Gastrointestinal:  Negative for abdominal pain, constipation, diarrhea,  nausea and vomiting.  Genitourinary:  Negative for dysuria, frequency and urgency.  Musculoskeletal:  Negative for myalgias.  Neurological:  Negative for dizziness, weakness and headaches.  Psychiatric/Behavioral:  Negative for confusion.   All other systems reviewed and are negative.    Physical Exam Triage Vital Signs ED Triage Vitals [10/07/21 0822]  Enc Vitals Group     BP (!) 142/93     Pulse Rate 94     Resp 18     Temp 98.7 F (37.1 C)     Temp Source Oral     SpO2 96 %     Weight      Height      Head Circumference      Peak Flow      Pain Score 7     Pain Loc      Pain Edu?      Excl. in [place]?    No data found.  Updated Vital Signs BP (!) 142/93 (BP Location: Left Arm)   Pulse 94   Temp 98.7 F (37.1 C) (Oral)    Resp 18   SpO2 96%   Visual Acuity Right Eye Distance:   Left Eye Distance:   Bilateral Distance:    Right Eye Near:   Left Eye Near:    Bilateral Near:     Physical Exam Vitals reviewed.  Constitutional:      General: She is not in acute distress.    Appearance: Normal appearance. She is not ill-appearing.  HENT:     Head: Normocephalic and atraumatic.     Right Ear: Tympanic membrane, ear canal and external ear normal. No tenderness. No middle ear effusion. There is no impacted cerumen. Tympanic membrane is not perforated, erythematous, retracted or bulging.     Left Ear: Tympanic membrane, ear canal and external ear normal. No tenderness.  No middle ear effusion. There is no impacted cerumen. Tympanic membrane is not perforated, erythematous, retracted or bulging.     Nose: Nose normal. No congestion.     Mouth/Throat:     Mouth: Mucous membranes are moist.     Pharynx: Uvula midline. No oropharyngeal exudate or posterior oropharyngeal erythema.  Eyes:     Extraocular Movements: Extraocular movements intact.     Pupils: Pupils are equal, round, and reactive to light.  Cardiovascular:     Rate and Rhythm: Normal rate and regular rhythm.     Heart sounds: Normal heart sounds.  Pulmonary:     Effort: Pulmonary effort is normal.     Breath sounds: Wheezing present. No decreased breath sounds, rhonchi or rales.     Comments: Expiratory wheezes throughout, worst in the lower lung fields. Oxygenating comfortably.  Abdominal:     Palpations: Abdomen is soft.     Tenderness: There is no abdominal tenderness. There is no guarding or rebound.  Lymphadenopathy:     Cervical: No cervical adenopathy.     Right cervical: No superficial cervical adenopathy.    Left cervical: No superficial cervical adenopathy.  Neurological:     General: No focal deficit present.     Mental Status: She is alert and oriented to person, place, and time.  Psychiatric:        Mood and Affect: Mood  normal.        Behavior: Behavior normal.        Thought Content: Thought content normal.        Judgment: Judgment normal.      UC Treatments /  Results  Labs (all labs ordered are listed, but only abnormal results are displayed) Labs Reviewed - No data to display  EKG   Radiology No results found.  Procedures Procedures (including critical care time)  Medications Ordered in UC Medications - No data to display  Initial Impression / Assessment and Plan / UC Course  I have reviewed the triage vital signs and the nursing notes.  Pertinent labs & imaging results that were available during my care of the patient were reviewed by me and considered in my medical decision making (see chart for details).     This patient is a very pleasant 56 y.o. year old female presenting with cough x5 days, increasingly productive of purulent sputum. Afebrile, nontachy. She does have expiratory wheezes throughout, but no focal consolidation or decreased lung sounds. No current CP or SOB. Current smoker with history of asthma. Given cough increasingly productive of purulent sputum, will manage as COPD exacerbation with augmentin and albuterol inhaler. Continue Mucinex for symptomatic relief. She is in agreement. ED return precautions discussed. Patient verbalizes understanding and agreement.    Final Clinical Impressions(s) / UC Diagnoses   Final diagnoses:  Acute cough  Cigarette smoker     Discharge Instructions      -Start the antibiotic-Augmentin (amoxicillin-clavulanate), 1 pill every 12 hours for 7 days.  You can take this with food like with breakfast and dinner. -Albuterol inhaler as needed for cough, wheezing, shortness of breath, 1 to 2 puffs every 6 hours as needed. -Continue Mucinex -With a virus, you're typically contagious for 5-7 days, or as long as you're having fevers.  -Follow-up if symptoms worsen, follow-up. This includes worsening shortness of breath, new chest pain,  new dizziness, new weakness.    ED Prescriptions     Medication Sig Dispense Auth. Provider   amoxicillin-clavulanate (AUGMENTIN) 875-125 MG tablet Take 1 tablet by mouth every 12 (twelve) hours. 14 tablet Hazel Sams, PA-C   albuterol (VENTOLIN HFA) 108 (90 Base) MCG/ACT inhaler Inhale 1-2 puffs into the lungs every 6 (six) hours as needed for wheezing or shortness of breath. 1 each Hazel Sams, PA-C      PDMP not reviewed this encounter.   Hazel Sams, PA-C 10/07/21 [id number]

## 2021-10-25 ENCOUNTER — Encounter (HOSPITAL_BASED_OUTPATIENT_CLINIC_OR_DEPARTMENT_OTHER): Payer: Self-pay | Admitting: Family Medicine

## 2021-10-31 ENCOUNTER — Encounter (HOSPITAL_COMMUNITY): Payer: Self-pay

## 2021-10-31 ENCOUNTER — Ambulatory Visit (INDEPENDENT_AMBULATORY_CARE_PROVIDER_SITE_OTHER): Payer: Self-pay

## 2021-10-31 ENCOUNTER — Ambulatory Visit (HOSPITAL_COMMUNITY)
Admission: RE | Admit: 2021-10-31 | Discharge: 2021-10-31 | Disposition: A | Discharge status: Home / Self Care | Payer: Self-pay | Source: Ambulatory Visit | Attending: Student | Admitting: Student

## 2021-10-31 VITALS — BP 132/84 | HR 96 | Temp 98.4°F | Resp 17

## 2021-10-31 DIAGNOSIS — R052 Subacute cough: Secondary | ICD-10-CM

## 2021-10-31 DIAGNOSIS — R059 Cough, unspecified: Secondary | ICD-10-CM

## 2021-10-31 DIAGNOSIS — J4521 Mild intermittent asthma with (acute) exacerbation: Secondary | ICD-10-CM

## 2021-10-31 DIAGNOSIS — R0989 Other specified symptoms and signs involving the circulatory and respiratory systems: Secondary | ICD-10-CM

## 2021-10-31 DIAGNOSIS — F17210 Nicotine dependence, cigarettes, uncomplicated: Secondary | ICD-10-CM

## 2021-10-31 MED ORDER — PREDNISONE 10 MG (21) PO TBPK: ORAL_TABLET | Freq: Every day | ORAL | 0 refills | Status: AC

## 2021-10-31 MED ORDER — ALBUTEROL SULFATE (2.5 MG/3ML) 0.083% IN NEBU
INHALATION_SOLUTION | RESPIRATORY_TRACT | Status: AC
  Filled 2021-10-31: qty 3

## 2021-10-31 MED ORDER — ALBUTEROL SULFATE (2.5 MG/3ML) 0.083% IN NEBU
2.5000 mg | INHALATION_SOLUTION | Freq: Once | RESPIRATORY_TRACT | Status: AC
  Administered 2021-10-31: 2.5 mg via RESPIRATORY_TRACT

## 2021-10-31 MED ORDER — LEVOFLOXACIN 750 MG PO TABS: 750.0000 mg | ORAL_TABLET | Freq: Every day | ORAL | 0 refills | Status: AC

## 2021-10-31 NOTE — ED Triage Notes (Signed)
Pt was seen here at beginning of June with cough, congestion that was affected by air quality from [place]. Was given antibiotics.  Reports having symptoms again and voice is changing, mucous production, reports can't breath well in mask when having to go outside with mask on.

## 2021-10-31 NOTE — Discharge Instructions (Addendum)
-  Levofloxacin once daily x5 days -Prednisone taper for cough/bronchitis. I recommend taking this in the morning as it could give you energy.  Avoid NSAIDs like ibuprofen and alleve while taking this medication as they can increase your risk of stomach upset and even GI bleeding when in combination with a steroid. You can continue tylenol (acetaminophen) up to 1000mg  3x daily. -Albuterol inhaler that you have at home - as needed for cough, wheezing, shortness of breath, 1 to 2 puffs every 6 hours as needed. -Follow-up if symptoms worsen, including coughing up red or brown, new chest pain, new fevers, etc.

## 2021-10-31 NOTE — ED Provider Notes (Signed)
[facility]    CSN: [id number] Arrival date & time: 10/31/21  1509      History   Chief Complaint Chief Complaint  Patient presents with   appt 315p    HPI Audrey Ramirez is a 56 y.o. female presenting with PND and productive cough. History current smoker, asthma; no formal diagnosis of COPD.  I last saw her on 10/07/2021, at that time we managed for asthma/COPD exacerbation with Augmentin and albuterol inhaler as needed. States symptoms improved but then returned over the last week with cough productive of yellow and green sputum, and thick postnasal drip. Mild sore throat and hoarse voice. 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 Denies currently SOB, CP, dizziness, weakness, fevers.   HPI  Past Medical History:  Diagnosis Date   Anxiety    Asthma    History of IBS     Patient Active Problem List   Diagnosis Date Noted   Dysphagia 08/26/2021   Wellness examination 06/30/2021   Abdominal pain 03/02/2021   Bloating 03/02/2021   History of 2019 novel coronavirus disease (COVID-19) 03/02/2021    Past Surgical History:  Procedure Laterality Date   ABDOMINAL HYSTERECTOMY     COLONOSCOPY     FOOT SURGERY Right     OB History   No obstetric history on file.      Home Medications    Prior to Admission medications   Medication Sig Start Date End Date Taking? Authorizing Provider  levofloxacin (LEVAQUIN) 750 MG tablet Take 1 tablet (750 mg total) by mouth daily. 10/31/21  Yes Rhys Martini, PA-C  predniSONE (STERAPRED UNI-PAK 21 TAB) 10 MG (21) TBPK tablet Take by mouth daily. Take 6 tabs by mouth daily  for 2 days, then 5 tabs for 2 days, then 4 tabs for 2 days, then 3 tabs for 2 days, 2 tabs for 2 days, then 1 tab by mouth daily for 2 days 10/31/21  Yes Cheree Ditto, Lyman Speller, PA-C  albuterol (VENTOLIN HFA) 108 (90 Base) MCG/ACT inhaler Inhale 1-2 puffs into the lungs every 6 (six)  hours as needed for wheezing or shortness of breath. 10/07/21   Rhys Martini, PA-C  diphenhydrAMINE (BENADRYL) 25 MG tablet Take 25 mg by mouth every 6 (six) hours as needed.    [provider]  omeprazole (PRILOSEC) 20 MG capsule Take 1 capsule (20 mg total) by mouth daily. 08/26/21   Arnaldo Natal, NP    Family History Family History  Problem Relation Age of Onset   Hyperlipidemia Mother    Diabetes Mother    Alzheimer's disease Father    Colon polyps Sister    Stroke Maternal Aunt    Colon cancer Neg Hx    Esophageal cancer Neg Hx    Pancreatic cancer Neg Hx    Stomach cancer Neg Hx     Social History Social History   Tobacco Use   Smoking status: Every Day    Packs/day: 0.50    Years: 25.00    Total pack years: 12.50    Types: Cigarettes    Passive exposure: Never   Smokeless tobacco: Never  Vaping Use   Vaping Use: Former  Substance Use Topics   Alcohol use: Yes    Alcohol/week: 7.0 standard drinks of alcohol    Types: 7 Cans of beer per week   Drug use: No  Allergies   Patient has no known allergies.   Review of Systems Review of Systems  Constitutional:  Negative for appetite change, chills and fever.  HENT:  Positive for congestion and postnasal drip. Negative for ear pain, rhinorrhea, sinus pressure, sinus pain and sore throat.   Eyes:  Negative for redness and visual disturbance.  Respiratory:  Positive for cough. Negative for chest tightness, shortness of breath and wheezing.   Cardiovascular:  Negative for chest pain and palpitations.  Gastrointestinal:  Negative for abdominal pain, constipation, diarrhea, nausea and vomiting.  Genitourinary:  Negative for dysuria, frequency and urgency.  Musculoskeletal:  Negative for myalgias.  Neurological:  Negative for dizziness, weakness and headaches.  Psychiatric/Behavioral:  Negative for confusion.   All other systems reviewed and are negative.    Physical Exam Triage Vital  Signs ED Triage Vitals  Enc Vitals Group     BP 10/31/21 1534 132/84     Pulse Rate 10/31/21 1534 96     Resp 10/31/21 1534 17     Temp 10/31/21 1534 98.4 F (36.9 C)     Temp Source 10/31/21 1534 Oral     SpO2 10/31/21 1534 92 %     Weight --      Height --      Head Circumference --      Peak Flow --      Pain Score 10/31/21 1533 0     Pain Loc --      Pain Edu? --      Excl. in GC? --    No data found.  Updated Vital Signs BP 132/84 (BP Location: Left Arm)   Pulse 96   Temp 98.4 F (36.9 C) (Oral)   Resp 17   SpO2 92%   Visual Acuity Right Eye Distance:   Left Eye Distance:   Bilateral Distance:    Right Eye Near:   Left Eye Near:    Bilateral Near:     Physical Exam Vitals reviewed.  Constitutional:      General: She is not in acute distress.    Appearance: Normal appearance. She is not ill-appearing.  HENT:     Head: Normocephalic and atraumatic.     Right Ear: Tympanic membrane, ear canal and external ear normal. No tenderness. No middle ear effusion. There is no impacted cerumen. Tympanic membrane is not perforated, erythematous, retracted or bulging.     Left Ear: Tympanic membrane, ear canal and external ear normal. No tenderness.  No middle ear effusion. There is no impacted cerumen. Tympanic membrane is not perforated, erythematous, retracted or bulging.     Nose: Nose normal. No congestion.     Mouth/Throat:     Mouth: Mucous membranes are moist.     Pharynx: Uvula midline. No oropharyngeal exudate or posterior oropharyngeal erythema.  Eyes:     Extraocular Movements: Extraocular movements intact.     Pupils: Pupils are equal, round, and reactive to light.  Cardiovascular:     Rate and Rhythm: Normal rate and regular rhythm.     Heart sounds: Normal heart sounds.  Pulmonary:     Effort: Pulmonary effort is normal.     Breath sounds: Decreased breath sounds present. No wheezing, rhonchi or rales.     Comments: Decreased breath sounds throughout   Abdominal:     Palpations: Abdomen is soft.     Tenderness: There is no abdominal tenderness. There is no guarding or rebound.  Lymphadenopathy:     Cervical: No cervical  adenopathy.     Right cervical: No superficial cervical adenopathy.    Left cervical: No superficial cervical adenopathy.  Neurological:     General: No focal deficit present.     Mental Status: She is alert and oriented to person, place, and time.  Psychiatric:        Mood and Affect: Mood normal.        Behavior: Behavior normal.        Thought Content: Thought content normal.        Judgment: Judgment normal.      UC Treatments / Results  Labs (all labs ordered are listed, but only abnormal results are displayed) Labs Reviewed - No data to display  EKG   Radiology DG Chest 2 View  Result Date: 10/31/2021 CLINICAL DATA:  Pt was seen here at beginning of June with cough, congestion that was affected by air quality from [place]. Was given antibiotics. Reports having symptoms again and voice is changing, mucous production, reports can't breath well in mask when having to go outside with mask on. EXAM: CHEST - 2 VIEW COMPARISON:  10/01/2019. FINDINGS: Cardiac silhouette is normal in size. No mediastinal or hilar masses or evidence of adenopathy. Clear lungs.  No pleural effusion or pneumothorax. Skeletal structures are intact. IMPRESSION: No active cardiopulmonary disease. Electronically Signed   By: Amie Portland M.D.   On: 10/31/2021 16:46    Procedures Procedures (including critical care time)  Medications Ordered in UC Medications  albuterol (PROVENTIL) (2.5 MG/3ML) 0.083% nebulizer solution 2.5 mg (2.5 mg Nebulization Given 10/31/21 1559)    Initial Impression / Assessment and Plan / UC Course  I have reviewed the triage vital signs and the nursing notes.  Pertinent labs & imaging results that were available during my care of the patient were reviewed by me and considered in my medical decision making  (see chart for details).     This patient is a very pleasant 56 y.o. year old female presenting with COPD exacerbation and postnasal drip/sinusitis. Today this pt is afebrile nontachycardic nontachypneic, oxygenating well on room air, no wheezes rhonchi or rales. Current smoker, history asthma. No formal diagnosis COPD. Symptoms improved after 10/07/21 visit / augmentin, but then returned over the last week.   CXR - No active cardiopulmonary disease. Albuterol nebulizer treatment administered with some improvement in the decreased lung sounds. There were no adventitious breath sounds before or after the treatment.   Will cover for sinusitis and COPD exacerbation with levaquin. Also sent prednisone taper. Continue albuterol inhaler prn.   ED return precautions discussed. Patient verbalizes understanding and agreement.    Final Clinical Impressions(s) / UC Diagnoses   Final diagnoses:  Subacute cough  Cigarette smoker  Asthma in adult, mild intermittent, with acute exacerbation     Discharge Instructions      -Levofloxacin once daily x5 days -Prednisone taper for cough/bronchitis. I recommend taking this in the morning as it could give you energy.  Avoid NSAIDs like ibuprofen and alleve while taking this medication as they can increase your risk of stomach upset and even GI bleeding when in combination with a steroid. You can continue tylenol (acetaminophen) up to 1000mg  3x daily. -Albuterol inhaler that you have at home - as needed for cough, wheezing, shortness of breath, 1 to 2 puffs every 6 hours as needed. -Follow-up if symptoms worsen, including coughing up red or brown, new chest pain, new fevers, etc.      ED Prescriptions  Medication Sig Dispense Auth. Provider   predniSONE (STERAPRED UNI-PAK 21 TAB) 10 MG (21) TBPK tablet Take by mouth daily. Take 6 tabs by mouth daily  for 2 days, then 5 tabs for 2 days, then 4 tabs for 2 days, then 3 tabs for 2 days, 2 tabs for 2 days,  then 1 tab by mouth daily for 2 days 42 tablet Rhys Martini, PA-C   levofloxacin (LEVAQUIN) 750 MG tablet Take 1 tablet (750 mg total) by mouth daily. 5 tablet Rhys Martini, PA-C      PDMP not reviewed this encounter.   Rhys Martini, PA-C 10/31/21 1658

## 2022-01-09 IMAGING — DX DG ABDOMEN 1V
1 series · 1 of 1 positions shown · non-contrast
Comparison: None.

CLINICAL DATA: Right upper quadrant pain.

EXAM:
ABDOMEN - 1 VIEW

[abdomen kub]
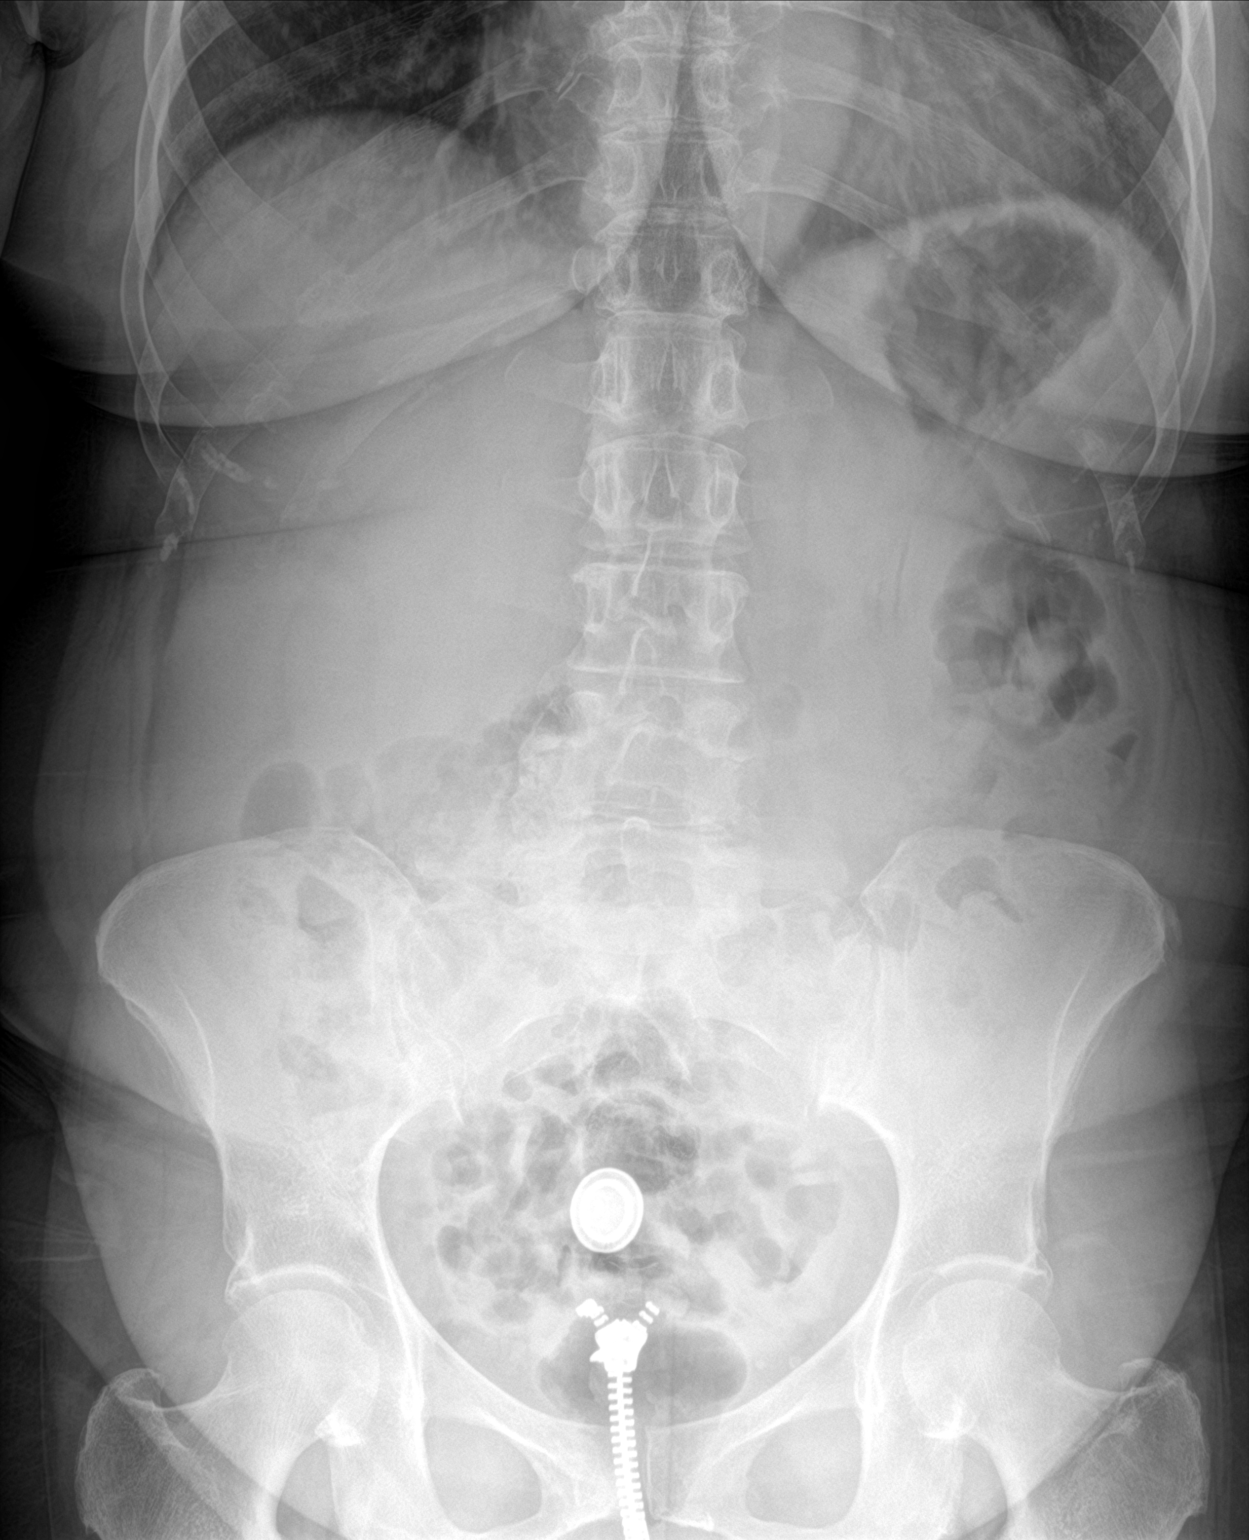

[1 of 1 positions shown; findings below may reference images not displayed]

FINDINGS: The bowel gas pattern is normal. No radio-opaque calculi or other
significant radiographic abnormality are seen.
IMPRESSION: Negative.
